# Patient Record
Sex: Female | Born: 1975 | Race: Black or African American | Hispanic: No | Marital: Single | Smoking: Never smoker
Health system: Southern US, Community
[De-identification: ages and names within clinical notes are randomized; demographics above are authoritative.]

## PROBLEM LIST (undated history)

## (undated) DIAGNOSIS — E538 Deficiency of other specified B group vitamins: Secondary | ICD-10-CM

## (undated) DIAGNOSIS — R51 Headache: Secondary | ICD-10-CM

## (undated) DIAGNOSIS — E739 Lactose intolerance, unspecified: Secondary | ICD-10-CM

## (undated) DIAGNOSIS — M545 Low back pain, unspecified: Secondary | ICD-10-CM

## (undated) DIAGNOSIS — R011 Cardiac murmur, unspecified: Secondary | ICD-10-CM

## (undated) DIAGNOSIS — D649 Anemia, unspecified: Secondary | ICD-10-CM

## (undated) DIAGNOSIS — E559 Vitamin D deficiency, unspecified: Secondary | ICD-10-CM

## (undated) DIAGNOSIS — J309 Allergic rhinitis, unspecified: Secondary | ICD-10-CM

## (undated) DIAGNOSIS — Q892 Congenital malformations of other endocrine glands: Secondary | ICD-10-CM

## (undated) DIAGNOSIS — R635 Abnormal weight gain: Secondary | ICD-10-CM

## (undated) DIAGNOSIS — R87619 Unspecified abnormal cytological findings in specimens from cervix uteri: Secondary | ICD-10-CM

## (undated) DIAGNOSIS — N943 Premenstrual tension syndrome: Secondary | ICD-10-CM

## (undated) HISTORY — DX: Allergic rhinitis, unspecified: J30.9

## (undated) HISTORY — DX: Abnormal weight gain: R63.5

## (undated) HISTORY — DX: Vitamin D deficiency, unspecified: E55.9

## (undated) HISTORY — DX: Anemia, unspecified: D64.9

## (undated) HISTORY — PX: OTHER SURGICAL HISTORY: SHX169

## (undated) HISTORY — DX: Congenital malformations of other endocrine glands: Q89.2

## (undated) HISTORY — DX: Morbid (severe) obesity due to excess calories: E66.01

## (undated) HISTORY — DX: Low back pain, unspecified: M54.50

## (undated) HISTORY — DX: Deficiency of other specified B group vitamins: E53.8

## (undated) HISTORY — DX: Low back pain: M54.5

## (undated) HISTORY — DX: Lactose intolerance, unspecified: E73.9

## (undated) HISTORY — DX: Unspecified abnormal cytological findings in specimens from cervix uteri: R87.619

## (undated) HISTORY — DX: Premenstrual tension syndrome: N94.3

---

## 2003-10-15 ENCOUNTER — Other Ambulatory Visit: Admission: RE | Admit: 2003-10-15 | Discharge: 2003-10-15 | Payer: Self-pay | Admitting: Family Medicine

## 2006-01-25 ENCOUNTER — Ambulatory Visit: Payer: Self-pay | Admitting: Family Medicine

## 2006-01-25 ENCOUNTER — Encounter (INDEPENDENT_AMBULATORY_CARE_PROVIDER_SITE_OTHER): Payer: Self-pay | Admitting: *Deleted

## 2006-01-25 ENCOUNTER — Other Ambulatory Visit: Admission: RE | Admit: 2006-01-25 | Discharge: 2006-01-25 | Payer: Self-pay | Admitting: Family Medicine

## 2006-01-25 LAB — CONVERTED CEMR LAB
AST: 14 units/L (ref 0–37)
Albumin: 3 g/dL — ABNORMAL LOW (ref 3.5–5.2)
Alkaline Phosphatase: 86 units/L (ref 39–117)
BUN: 5 mg/dL — ABNORMAL LOW (ref 6–23)
Basophils Absolute: 0.1 10*3/uL (ref 0.0–0.1)
CO2: 28 meq/L (ref 19–32)
GFR calc non Af Amer: 90 mL/min
Glomerular Filtration Rate, Af Am: 108 mL/min/{1.73_m2}
HCT: 36.1 % (ref 36.0–46.0)
Hemoglobin: 11.7 g/dL — ABNORMAL LOW (ref 12.0–15.0)
MCHC: 32.4 g/dL (ref 30.0–36.0)
Monocytes Relative: 5.9 % (ref 3.0–11.0)
Neutro Abs: 1.8 10*3/uL (ref 1.4–7.7)
Neutrophils Relative %: 44 % (ref 43.0–77.0)
Potassium: 4.4 meq/L (ref 3.5–5.1)
RBC: 4.72 M/uL (ref 3.87–5.11)
RDW: 17.1 % — ABNORMAL HIGH (ref 11.5–14.6)
Sodium: 138 meq/L (ref 135–145)
Total Bilirubin: 0.6 mg/dL (ref 0.3–1.2)
Total Protein: 7.1 g/dL (ref 6.0–8.3)
VLDL: 9 mg/dL (ref 0–40)

## 2006-08-23 ENCOUNTER — Ambulatory Visit: Payer: Self-pay | Admitting: Family Medicine

## 2006-08-23 ENCOUNTER — Other Ambulatory Visit: Admission: RE | Admit: 2006-08-23 | Discharge: 2006-08-23 | Payer: Self-pay | Admitting: Family Medicine

## 2006-08-23 ENCOUNTER — Encounter: Payer: Self-pay | Admitting: Family Medicine

## 2006-08-23 DIAGNOSIS — J45901 Unspecified asthma with (acute) exacerbation: Secondary | ICD-10-CM | POA: Insufficient documentation

## 2006-08-23 DIAGNOSIS — R87619 Unspecified abnormal cytological findings in specimens from cervix uteri: Secondary | ICD-10-CM

## 2006-08-23 HISTORY — DX: Unspecified abnormal cytological findings in specimens from cervix uteri: R87.619

## 2006-08-23 LAB — CONVERTED CEMR LAB
Blood in Urine, dipstick: NEGATIVE
Glucose, Urine, Semiquant: NEGATIVE
Nitrite: NEGATIVE
Pap Smear: ABNORMAL
Specific Gravity, Urine: 1.01
WBC Urine, dipstick: NEGATIVE
pH: 7

## 2006-08-29 ENCOUNTER — Encounter (INDEPENDENT_AMBULATORY_CARE_PROVIDER_SITE_OTHER): Payer: Self-pay | Admitting: *Deleted

## 2007-04-24 ENCOUNTER — Encounter: Payer: Self-pay | Admitting: Family Medicine

## 2007-04-24 ENCOUNTER — Other Ambulatory Visit: Admission: RE | Admit: 2007-04-24 | Discharge: 2007-04-24 | Payer: Self-pay | Admitting: Family Medicine

## 2007-04-24 ENCOUNTER — Ambulatory Visit: Payer: Self-pay | Admitting: Family Medicine

## 2007-04-24 DIAGNOSIS — J45909 Unspecified asthma, uncomplicated: Secondary | ICD-10-CM | POA: Insufficient documentation

## 2007-04-28 ENCOUNTER — Encounter (INDEPENDENT_AMBULATORY_CARE_PROVIDER_SITE_OTHER): Payer: Self-pay | Admitting: *Deleted

## 2007-05-23 ENCOUNTER — Ambulatory Visit: Payer: Self-pay | Admitting: Family Medicine

## 2009-02-24 ENCOUNTER — Ambulatory Visit: Payer: Self-pay | Admitting: Internal Medicine

## 2009-02-24 DIAGNOSIS — G43909 Migraine, unspecified, not intractable, without status migrainosus: Secondary | ICD-10-CM

## 2009-02-24 LAB — CONVERTED CEMR LAB
ALT: 13 units/L (ref 0–35)
Albumin: 3.2 g/dL — ABNORMAL LOW (ref 3.5–5.2)
Basophils Relative: 0.2 % (ref 0.0–3.0)
Bilirubin, Direct: 0.1 mg/dL (ref 0.0–0.3)
CO2: 28 meq/L (ref 19–32)
Chloride: 107 meq/L (ref 96–112)
Cholesterol: 151 mg/dL (ref 0–200)
Eosinophils Relative: 5.2 % — ABNORMAL HIGH (ref 0.0–5.0)
HCT: 32.8 % — ABNORMAL LOW (ref 36.0–46.0)
Ketones, ur: NEGATIVE mg/dL
LDL Cholesterol: 104 mg/dL — ABNORMAL HIGH (ref 0–99)
Leukocytes, UA: NEGATIVE
Lymphs Abs: 1.7 10*3/uL (ref 0.7–4.0)
MCV: 74.4 fL — ABNORMAL LOW (ref 78.0–100.0)
Monocytes Absolute: 0.2 10*3/uL (ref 0.1–1.0)
Neutro Abs: 1.7 10*3/uL (ref 1.4–7.7)
Potassium: 4.2 meq/L (ref 3.5–5.1)
RBC: 4.41 M/uL (ref 3.87–5.11)
Specific Gravity, Urine: 1.01 (ref 1.000–1.030)
TSH: 1.77 microintl units/mL (ref 0.35–5.50)
Total CHOL/HDL Ratio: 4
Total Protein: 7.1 g/dL (ref 6.0–8.3)
Urine Glucose: NEGATIVE mg/dL
WBC: 3.8 10*3/uL — ABNORMAL LOW (ref 4.5–10.5)
pH: 7.5 (ref 5.0–8.0)

## 2009-02-26 ENCOUNTER — Telehealth: Payer: Self-pay | Admitting: Internal Medicine

## 2009-02-26 ENCOUNTER — Encounter: Payer: Self-pay | Admitting: Internal Medicine

## 2010-02-16 ENCOUNTER — Ambulatory Visit: Payer: Self-pay | Admitting: Internal Medicine

## 2010-02-16 DIAGNOSIS — J309 Allergic rhinitis, unspecified: Secondary | ICD-10-CM

## 2010-04-12 LAB — CONVERTED CEMR LAB
ALT: 13 units/L (ref 0–35)
AST: 16 units/L (ref 0–37)
Basophils Relative: 1 % (ref 0.0–1.0)
Bilirubin, Direct: 0.1 mg/dL (ref 0.0–0.3)
CO2: 28 meq/L (ref 19–32)
Calcium: 8.9 mg/dL (ref 8.4–10.5)
Eosinophils Absolute: 0.2 10*3/uL (ref 0.0–0.6)
Eosinophils Relative: 5 % (ref 0.0–5.0)
GFR calc Af Amer: 108 mL/min
Glucose, Bld: 87 mg/dL (ref 70–99)
HCT: 34.7 % — ABNORMAL LOW (ref 36.0–46.0)
Hemoglobin: 11.3 g/dL — ABNORMAL LOW (ref 12.0–15.0)
Lymphocytes Relative: 40.7 % (ref 12.0–46.0)
MCV: 77.4 fL — ABNORMAL LOW (ref 78.0–100.0)
Neutro Abs: 1.8 10*3/uL (ref 1.4–7.7)
Neutrophils Relative %: 43.3 % (ref 43.0–77.0)
Platelets: 306 10*3/uL (ref 150–400)
Sodium: 141 meq/L (ref 135–145)
TSH: 2.11 microintl units/mL (ref 0.35–5.50)
Total Protein: 7.3 g/dL (ref 6.0–8.3)
Triglycerides: 50 mg/dL (ref 0–149)
VLDL: 10 mg/dL (ref 0–40)
WBC: 4 10*3/uL — ABNORMAL LOW (ref 4.5–10.5)

## 2010-04-15 NOTE — Assessment & Plan Note (Signed)
Summary: HEADACHES/NWS   Vital Signs:  Patient profile:   35 year old female Height:      64 inches (162.56 cm) Weight:      216 pounds BMI:     37.21 O2 Sat:      96 % on Room air Temp:     98.4 degrees F (36.89 degrees C) oral Pulse rate:   90 / minute BP sitting:   118 / 70  (left arm) Cuff size:   large  Vitals Entered By: Orlan Leavens RMA (February 16, 2010 4:04 PM)  O2 Flow:  Room air CC: Headaches Is Patient Diabetic? No Pain Assessment Patient in pain? no        Primary Care Provider:  Newt Lukes MD  CC:  Headaches.  History of Present Illness: here for headaches  re: her headaches, concerned taking too many Goodies 3-4x/week  Headaches      This is a 35 year old woman who presents with Headaches.  The current symptoms began 8-12 hrs ago.  On a scale of 1 to 10, the intensity is described as a 4 at this time.  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 today, headache has not responded quickly to Goody's so cam for eval - current symptoms different than usual menstral migraines. recent URI symptoms but no fever.  The patient reports nausea, nasal congestion, sinus pain, and sinus pressure, but denies vomiting, sweats, tearing of eyes, photophobia, and phonophobia.  The headache is described as intermittent and stabbing.  The location of the pain is left maxillary.  The patient denies the following high-risk features: fever, neck pain/stiffness, vision loss or change, focal weakness, altered mental status, rash, trauma, pain worse with exertion, and anticoagulation use.  The headaches are precipitated by stress and menses.  Prior treatment has included a NSAID.    Clinical Review Panels:  Lipid Management   Cholesterol:  151 (02/24/2009)   LDL (bad choesterol):  104 (02/24/2009)   HDL (good cholesterol):  35.80 (02/24/2009)   Triglycerides:  44 (01/25/2006)  CBC   WBC:  3.8 (02/24/2009)   RBC:  4.41  (02/24/2009)   Hgb:  10.5 (02/24/2009)   Hct:  32.8 (02/24/2009)   Platelets:  292.0 (02/24/2009)   MCV  74.4 (02/24/2009)   MCHC  32.1 (02/24/2009)   RDW  18.4 H % (02/24/2009)   PMN:  46.2 (02/24/2009)   Lymphs:  43.6 (02/24/2009)   Monos:  4.8 (02/24/2009)   Eosinophils:  5.2 (02/24/2009)   Basophil:  0.2 (02/24/2009)  Complete Metabolic Panel   Glucose:  78 (02/24/2009)   Sodium:  142 (02/24/2009)   Potassium:  4.2 (02/24/2009)   Chloride:  107 (02/24/2009)   CO2:  28 (02/24/2009)   BUN:  7 (02/24/2009)   Creatinine:  0.8 (02/24/2009)   Albumin:  3.2 (02/24/2009)   Total Protein:  7.1 (02/24/2009)   Calcium:  8.7 (02/24/2009)   Total Bili:  0.7 (02/24/2009)   Alk Phos:  80 (02/24/2009)   SGPT (ALT):  13 (02/24/2009)   SGOT (AST):  16 (02/24/2009)   Current Medications (verified): 1)  Goodys Body Pain 500-325 Mg Pack (Aspirin-Acetaminophen) .... Use Prn 2)  Allergy Sinus-D Max St 2-30-500 Mg Tabs (Chlorphen-Pseudoephed-Apap) .... Use Prn  Allergies (verified): 1)  ! Pcn 2)  ! Sulfa 3)  ! 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  The patient denies weight loss, vision loss, chest pain, syncope, hemoptysis, muscle weakness, and difficulty walking.    Physical Exam  General:  overweight-appearing.  alert, well-developed, well-nourished, and cooperative to examination. nontoxic and comfortable   Head:  Normocephalic and atraumatic without obvious abnormalities. No apparent alopecia or balding. Eyes:  vision grossly intact; pupils equal, round and reactive to light.  conjunctiva and lids normal.    Ears:  normal pinnae bilaterally, without erythema, swelling, or tenderness to palpation.  TMs clear, without effusion, or cerumen impaction. Hearing grossly normal bilaterally  Nose:  +sinus tenderness to palp Mouth:  teeth and gums in good repair; mucous membranes moist, without lesions or ulcers. oropharynx clear without exudate, no erythema. +PND Lungs:  normal respiratory effort, no intercostal retractions or use of accessory muscles; normal breath sounds bilaterally - no crackles and no wheezes.    Heart:  normal rate, regular rhythm, no murmur, and no rub. BLE without edema. normal DP pulses and normal cap refill in all 4 extremities    Neurologic:  alert & oriented X3 and cranial nerves II-XII symetrically intact.  strength normal in all extremities, sensation intact to light touch, and gait normal. speech fluent without dysarthria or aphasia; follows commands with good comprehension.    Impression & Recommendations:  Problem # 1:  MIGRAINE, MENSTRUAL (ICD-625.4)  failed control with OCP trials in past 12 months resume imitrex as needed and try low dose amitriptyline for prevention and mgmt -  explained same to pt including possioble risks and expected benefits - she understands and agrees to same current neuro exam benign but needs inc tx for sinus and allergy symptoms - see next f/u 6-8 weeks for med review and symptoms mgmt  The following medications were removed from the medication list:    Sumatriptan Succinate 6 Mg/0.23ml Soln (Sumatriptan succinate) .Marland Kitchen... 1 spray in nostril as needed for migraine - may repeat x 1 in 2 hours if persisiting headache symptoms Her updated medication list for this problem includes:    Goodys Body Pain 500-325 Mg Pack (Aspirin-acetaminophen) ..... Use as needed    Sumatriptan Succinate 100 Mg Tabs (Sumatriptan succinate) .Marland Kitchen... 1/2-1 by mouth as needed for migraine symptoms  Orders: Prescription Created Electronically [phone])  Problem # 2:  ALLERGIC RHINITIS (ICD-477.9)  add nasal steroid for sinus symptoms as this may be contrib to  current exam of HA symptoms - see above Her updated medication list for this problem includes:    Fluticasone Propionate 50 Mcg/act Susp (Fluticasone propionate) .Marland Kitchen... 2 sprasy each nostril  every morning for sinus and allergy symptoms - use everyday  Orders: Prescription Created Electronically [phone])  Complete Medication List: 1)  Goodys Body Pain 500-325 Mg Pack (Aspirin-acetaminophen) .... Use as needed 2)  Allergy Sinus-d Max St 2-30-500 Mg Tabs (Chlorphen-pseudoephed-apap) .... Use as needed 3)  Fluticasone Propionate 50 Mcg/act Susp (Fluticasone propionate) .... 2 sprasy each nostril  every morning for sinus and allergy symptoms - use everyday 4)  Amitriptyline Hcl 10 Mg Tabs (Amitriptyline hcl) .Marland Kitchen.. 1 by mouth at bedtime 5)  Sumatriptan Succinate 100 Mg Tabs (Sumatriptan succinate) .... 1/2-1 by mouth as needed for migraine symptoms  Patient Instructions: 1)  it was good to see you today. 2)  use generic flonase spray  for sinus and allergy symptoms - 3)  also start low dose amitriptyline for headache prevention and managment - 4)  still ok to use imitrex (pills) as needed for severe migraine symptoms around your cycle 5)  your prescriptions have been electronically submitted to your pharmacy. Please take as directed. Contact our office if you believe you're having problems with the medication(s).  6)  try to use less Goody's if able - frequent use (>3x/week) can make headache symptoms worse 7)  Please schedule a follow-up appointment in 6-8 weeks tor eview headache symptoms and medications, call sooner if problems.  Prescriptions: SUMATRIPTAN SUCCINATE 100 MG TABS (SUMATRIPTAN SUCCINATE) 1/2-1 by mouth as needed for migraine symptoms  #6 x 2   Entered and Authorized by:   Newt Lukes MD   Signed by:   Newt Lukes MD on 02/16/2010   Method used:   Electronically to        [facility] [address] [phone]* (retail)       [address]       [place], [place]  [id number]       Ph: [phone] or [phone]       Fax: [phone]   RxID:   [id number] AMITRIPTYLINE HCL 10 MG TABS (AMITRIPTYLINE HCL) 1 by mouth at bedtime  #30 x 3   Entered and Authorized by:   Newt Lukes MD   Signed by:   Newt Lukes MD on 02/16/2010   Method used:   [facility]        [facility] [address] [facility] [address] [phone]* (retail)       [address]       [place], [place]  [id number]       Ph: [phone] or [phone]       Fax: [phone]   RxID:   [id number] FLUTICASONE PROPIONATE 50 MCG/ACT SUSP (FLUTICASONE PROPIONATE) 2 sprasy each nostril  every morning for sinus and allergy symptoms - use everyday  #1 x 3   Entered and Authorized by:   Newt Lukes MD   Signed by:   Newt Lukes MD on 02/16/2010   Method used:   [facility]        [facility]  [address] [phone]* (retail)       [address]       [place], [place]  [address]       Ph: [phone] or [phone]       Fax: [phone]   RxID:   [id number]    Orders Added: 1)  Est. Patient Level IV [78242] 2)  Prescription Created Electronically [phone]

## 2010-06-02 ENCOUNTER — Other Ambulatory Visit: Payer: Self-pay | Admitting: Obstetrics & Gynecology

## 2010-09-17 ENCOUNTER — Encounter: Payer: Self-pay | Admitting: Internal Medicine

## 2010-09-18 ENCOUNTER — Other Ambulatory Visit (INDEPENDENT_AMBULATORY_CARE_PROVIDER_SITE_OTHER): Payer: BC Managed Care – PPO

## 2010-09-18 ENCOUNTER — Ambulatory Visit (INDEPENDENT_AMBULATORY_CARE_PROVIDER_SITE_OTHER): Payer: BC Managed Care – PPO | Admitting: Internal Medicine

## 2010-09-18 ENCOUNTER — Encounter: Payer: Self-pay | Admitting: Internal Medicine

## 2010-09-18 ENCOUNTER — Other Ambulatory Visit (INDEPENDENT_AMBULATORY_CARE_PROVIDER_SITE_OTHER): Payer: BC Managed Care – PPO | Admitting: Internal Medicine

## 2010-09-18 VITALS — BP 110/78 | HR 82 | Temp 97.8°F | Ht 64.0 in | Wt 236.0 lb

## 2010-09-18 DIAGNOSIS — R220 Localized swelling, mass and lump, head: Secondary | ICD-10-CM

## 2010-09-18 DIAGNOSIS — J309 Allergic rhinitis, unspecified: Secondary | ICD-10-CM

## 2010-09-18 DIAGNOSIS — Z0000 Encounter for general adult medical examination without abnormal findings: Secondary | ICD-10-CM

## 2010-09-18 DIAGNOSIS — R221 Localized swelling, mass and lump, neck: Secondary | ICD-10-CM

## 2010-09-18 LAB — CBC WITH DIFFERENTIAL/PLATELET
Basophils Relative: 1.3 % (ref 0.0–3.0)
Eosinophils Relative: 5.4 % — ABNORMAL HIGH (ref 0.0–5.0)
Hemoglobin: 8.9 g/dL — ABNORMAL LOW (ref 12.0–15.0)
Lymphocytes Relative: 39.5 % (ref 12.0–46.0)
MCHC: 31.4 g/dL (ref 30.0–36.0)
MCV: 61.6 fl — ABNORMAL LOW (ref 78.0–100.0)
Neutro Abs: 2.5 10*3/uL (ref 1.4–7.7)
Neutrophils Relative %: 48.2 % (ref 43.0–77.0)
RBC: 4.57 Mil/uL (ref 3.87–5.11)
WBC: 5.2 10*3/uL (ref 4.5–10.5)

## 2010-09-18 LAB — BASIC METABOLIC PANEL
CO2: 28 mEq/L (ref 19–32)
Calcium: 8.7 mg/dL (ref 8.4–10.5)
Chloride: 109 mEq/L (ref 96–112)
Creatinine, Ser: 0.7 mg/dL (ref 0.4–1.2)
Sodium: 140 mEq/L (ref 135–145)

## 2010-09-18 LAB — HEPATIC FUNCTION PANEL
Albumin: 3.6 g/dL (ref 3.5–5.2)
Alkaline Phosphatase: 77 U/L (ref 39–117)
Total Protein: 7.5 g/dL (ref 6.0–8.3)

## 2010-09-18 LAB — URINALYSIS, ROUTINE W REFLEX MICROSCOPIC
Ketones, ur: NEGATIVE
Specific Gravity, Urine: 1.02 (ref 1.000–1.030)
Total Protein, Urine: NEGATIVE
Urine Glucose: NEGATIVE

## 2010-09-18 LAB — LIPID PANEL
HDL: 38.6 mg/dL — ABNORMAL LOW (ref >39.00)
LDL Cholesterol: 97 mg/dL (ref 0–99)
Total CHOL/HDL Ratio: 4
Triglycerides: 47 mg/dL (ref 0.0–149.0)
VLDL: 9.4 mg/dL (ref 0.0–40.0)

## 2010-09-18 LAB — FERRITIN: Ferritin: 5 ng/mL — ABNORMAL LOW (ref 10.0–291.0)

## 2010-09-18 LAB — TSH: TSH: 2.45 u[IU]/mL (ref 0.35–5.50)

## 2010-09-18 MED ORDER — FLUTICASONE PROPIONATE 50 MCG/ACT NA SUSP: 2.0000 | NASAL | Status: DC

## 2010-09-18 NOTE — Patient Instructions (Signed)
It was good to see you today. We have reviewed your prior records including labs and tests today we'll make referral for neck ultrasound. Our office will contact you regarding appointment(s) once made and again after results reviewed Lab test(s) ordered today. Your results will be called to you after review (48-72hours after test completion). If any changes need to be made, you will be notified at that time. Watch the weight! Work on lifestyle changes as discussed (low fat, low carb, increased protein diet; improved exercise efforts; weight loss) to control sugar, blood pressure and cholesterol levels and/or reduce risk of developing other medical problems. Look into [email or web address] or other type of food journal to assist you in this process. Medications reviewed, no changes at this time. Refill on medication(s) as discussed today.

## 2010-09-18 NOTE — Progress Notes (Signed)
Subjective:    Patient ID: Belinda Williams, female    DOB: 1975-09-30, 35 y.o.   MRN: [id number]  HPI patient is here today for annual physical. Patient feels well today  Also concern about lump in neck Onset noted 2 weeks ago - no change in size since that time No sticking with swallow, no burning or choking No trauma -   Past Medical History  Diagnosis Date  . PAP SMEAR, ABNORMAL 08/23/2006  . ALLERGIC RHINITIS   . ASTHMA NOS W/ACUTE EXACERBATION   . MIGRAINE, MENSTRUAL   . Morbid obesity    Family History  Problem Relation Age of Onset  . Breast cancer Maternal Aunt   . Diabetes Maternal Grandmother   . Hypertension Maternal Grandmother   . Stroke Maternal Grandmother   . Hypertension Other    History  Substance Use Topics  . Smoking status: Never Smoker   . Smokeless tobacco: Not on file  . Alcohol Use: Yes   Review of Systems Constitutional: Negative for fever.  Respiratory: Negative for cough and shortness of breath.   ENT: hard "knot" palpable in throat - no dysphagia - no odynophagia, no sore throat Cardiovascular: Negative for chest pain.  Gastrointestinal: Negative for abdominal pain.  Musculoskeletal: Negative for gait problem.  Skin: Negative for rash.  Neurological: Negative for dizziness.  No other specific complaints in a complete review of systems (except as listed in HPI above).     Objective:   Physical Exam BP 110/78  Pulse 82  Temp(Src) 97.8 F (36.6 C) (Oral)  Ht 5\' 4"  (1.626 m)  Wt 236 lb (107.049 kg)  BMI 40.51 kg/m2  SpO2 98% Wt Readings from Last 3 Encounters:  09/18/10 236 lb (107.049 kg)  02/16/10 216 lb (97.977 kg)  02/24/09 216 lb 12.8 oz (98.34 kg)   Physical Exam  Constitutional: She is overweight; oriented to person, place, and time. She appears well-developed and well-nourished. No distress.  HENT: Head: Normocephalic and atraumatic. Ears; B TMs ok, no erythema or effusion; Nose: Nose normal.  Mouth/Throat: Oropharynx  is clear and moist. No oropharyngeal exudate.   Eyes: Conjunctivae and EOM are normal. Pupils are equal, round, and reactive to light. No scleral icterus.  Neck: thick. Normal range of motion. Neck supple. No JVD present. No thyromegaly present or thyroid nodules. hard 1cm nodular prominence to L side of anterior neck near cricoid Cardiovascular: Normal rate, regular rhythm and normal heart sounds.  No murmur heard. No BLE edema. Pulmonary/Chest: Effort normal and breath sounds normal. No respiratory distress. She has no wheezes.  Abdominal: Soft. Bowel sounds are normal. She exhibits no distension. There is no tenderness.  Musculoskeletal: Normal range of motion, no joint effusions. No gross deformities Neurological: She is alert and oriented to person, place, and time. No cranial nerve deficit. Coordination normal.  Skin: Skin is warm and dry. No rash noted. No erythema.  Psychiatric: She has a normal mood and affect. Her behavior is normal. Judgment and thought content normal.       Lab Results  Component Value Date   WBC 3.8* 02/24/2009   HGB 10.5* 02/24/2009   HCT 32.8* 02/24/2009   PLT 292.0 02/24/2009   CHOL 151 02/24/2009   TRIG 57.0 02/24/2009   HDL 35.80* 02/24/2009   ALT 13 02/24/2009   AST 16 02/24/2009   NA 142 02/24/2009   K 4.2 02/24/2009   CL 107 02/24/2009   CREATININE 0.8 02/24/2009   BUN 7 02/24/2009   CO2 28  02/24/2009   TSH 1.77 02/24/2009     Assessment & Plan:  CPX - v70.0 - Patient has been counseled on age-appropriate routine health concerns for screening and prevention. These are reviewed and up-to-date. Immunizations are up-to-date or declined. Labs ordered - will be reviewed.  Neck mass - 1cm L anterior neck - ?bony prominence vs other - check soft tissue US and consider refer to ENT/other as needed  Allergic rhinitis - improved overall with flonase - continue same

## 2010-09-24 ENCOUNTER — Ambulatory Visit
Admission: RE | Admit: 2010-09-24 | Discharge: 2010-09-24 | Disposition: A | Discharge status: Home / Self Care | Payer: BC Managed Care – PPO | Source: Ambulatory Visit | Attending: Internal Medicine | Admitting: Internal Medicine

## 2010-09-24 ENCOUNTER — Other Ambulatory Visit: Payer: Self-pay | Admitting: Internal Medicine

## 2010-09-24 DIAGNOSIS — R221 Localized swelling, mass and lump, neck: Secondary | ICD-10-CM

## 2010-09-30 ENCOUNTER — Ambulatory Visit (HOSPITAL_COMMUNITY)
Admission: RE | Admit: 2010-09-30 | Discharge: 2010-09-30 | Disposition: A | Discharge status: Home / Self Care | Payer: BC Managed Care – PPO | Source: Ambulatory Visit | Attending: Internal Medicine | Admitting: Internal Medicine

## 2010-09-30 DIAGNOSIS — R221 Localized swelling, mass and lump, neck: Secondary | ICD-10-CM

## 2010-09-30 DIAGNOSIS — R9389 Abnormal findings on diagnostic imaging of other specified body structures: Secondary | ICD-10-CM | POA: Insufficient documentation

## 2010-09-30 DIAGNOSIS — R599 Enlarged lymph nodes, unspecified: Secondary | ICD-10-CM | POA: Insufficient documentation

## 2010-09-30 DIAGNOSIS — E079 Disorder of thyroid, unspecified: Secondary | ICD-10-CM | POA: Insufficient documentation

## 2010-09-30 MED ORDER — GADOBENATE DIMEGLUMINE 529 MG/ML IV SOLN
20.0000 mL | Freq: Once | INTRAVENOUS | Status: AC | PRN
  Administered 2010-09-30: 20 mL via INTRAVENOUS

## 2010-10-19 ENCOUNTER — Ambulatory Visit (INDEPENDENT_AMBULATORY_CARE_PROVIDER_SITE_OTHER): Payer: BC Managed Care – PPO | Admitting: Endocrinology

## 2010-10-19 ENCOUNTER — Encounter: Payer: Self-pay | Admitting: Endocrinology

## 2010-10-19 DIAGNOSIS — E042 Nontoxic multinodular goiter: Secondary | ICD-10-CM

## 2010-10-19 DIAGNOSIS — Q892 Congenital malformations of other endocrine glands: Secondary | ICD-10-CM

## 2010-10-19 NOTE — Patient Instructions (Addendum)
Let's check a biopsy of the 2 thyroid nodules.  you will receive a phone call, with a day and time for an appointment. A few days later, please call [phone] to hear your test results.  You will be prompted to enter the 9-digit "MRN" number that appears at the top left of this page, followed by #.  Then you will hear the message. If, (as expected), no cancer is seen, please return here in 6-12 months.   No treatment is needed for the cyst you can feel on your neck, as it is harmless.

## 2010-10-19 NOTE — Progress Notes (Signed)
Subjective:    Patient ID: Belinda Williams, female    DOB: [birth year], 35 y.o.   MRN: [id number]  HPI Pt states 6 weeks of moderate sensation of fullness at the anterior neck.  No assoc dysphagia. Past Medical History  Diagnosis Date  . PAP SMEAR, ABNORMAL 08/23/2006  . ALLERGIC RHINITIS   . MIGRAINE, MENSTRUAL   . Morbid obesity   . Anemia   . Asthma     Past Surgical History  Procedure Date  . No past surgeries     History   Social History  . Marital Status: Single    Spouse Name: N/A    Number of Children: N/A  . Years of Education: N/A   Occupational History  . Not on file.   Social History Main Topics  . Smoking status: Never Smoker   . Smokeless tobacco: Not on file  . Alcohol Use: Yes  . Drug Use: No  . Sexually Active: No     Single, not sexual active at this time   Other Topics Concern  . Not on file   Social History Narrative  . No narrative on file    Current Outpatient Prescriptions on File Prior to Visit  Medication Sig Dispense Refill  . amitriptyline (ELAVIL) 10 MG tablet Take 10 mg by mouth at bedtime.        . Aspirin-Acetaminophen (GOODY BODY PAIN) 500-325 MG PACK Take by mouth as needed.        . fluticasone (FLONASE) 50 MCG/ACT nasal spray Place 2 sprays into the nose every morning.  16 g  6  . SUMAtriptan (IMITREX) 100 MG tablet Take 100 mg by mouth every 2 (two) hours as needed.          Allergies  Allergen Reactions  . Ciprofloxacin   . Penicillins   . Sulfonamide Derivatives     Family History  Problem Relation Age of Onset  . Breast cancer Maternal Aunt   . Cancer Maternal Aunt     breast  . Diabetes Maternal Grandmother   . Hypertension Maternal Grandmother   . Stroke Maternal Grandmother   . Hypertension Other   no goiter or other thyroid probs.   BP 122/82  Pulse 83  Temp(Src) 98 F (36.7 C) (Oral)  Ht 5' 4.5" (1.638 m)  Wt 233 lb 9.6 oz (105.96 kg)  BMI 39.48 kg/m2  SpO2 99%  LMP 10/11/2010  Review of  Systems denies hoarseness, double vision, palpitations, sob, diarrhea, polyuria, excessive diaphoresis, numbness, tremor, anxiety, hypoglycemia, and rhinorrhea.  She has chronic headache and weight gain.  She has fatigue, easy bruising, and myalgias.    Objective:   Physical Exam VS: see vs page GEN: no distress HEAD: head: no deformity eyes: no periorbital swelling, no proptosis external nose and ears are normal mouth: no lesion seen NECK: the thyroglossal duct cyst is easily palpable at the midline.  At the thyroid area, the appears to be enlargement, but i cannot tell details CHEST WALL: no deformity CV: reg rate and rhythm, no murmur ABD: abdomen is soft, nontender.  no hepatosplenomegaly.  not distended.  no hernia. MUSCULOSKELETAL: muscle bulk and strength are grossly normal.  no obvious joint swelling.  gait is normal and steady EXTEMITIES: no deformity.  no ulcer on the feet.  feet are of normal color and temp.  no edema PULSES: dorsalis pedis intact bilat.  no carotid bruit NEURO:  cn 2-12 grossly intact.   readily moves all 4's.  sensation is  intact to touch on the feet SKIN:  Normal texture and temperature.  No rash or suspicious lesion is visible.   NODES:  None palpable at the neck PSYCH: alert, oriented x3.  Does not appear anxious nor depressed.  outside test results are reviewed: ULTRASOUND: 1. The patient's palpable concern is a dominant complex solid and  cystic lesion in the midline submandibular region suspicious for  inflammation or neoplasm arising from a thyroglossal duct remnant.  MRI of the neck with and without contrast is recommended for  further evaluation.  2. There are solid nodules in both thyroid lobes without highly  suspicious characteristics other than size. Depending on the  etiology of the palpable concern, these lesions can be addressed  with follow-up ultrasound.    MRI: 15 x 17 x 9 mm cystic and solid lesion correlating with the    sonographic abnormality in the area of palpable concern; findings  are most consistent with a thyroglossal duct cyst. Mild  surrounding inflammation suggests possible superimposed infection.  Superimposed thyroid neoplasm within these lesions is extremely  rare and is felt to be unlikely Lab Results  Component Value Date   TSH 2.45 09/18/2010      Assessment & Plan:  Multinodular goiter, usually hereditary.   Euthyroid now. 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

## 2010-10-22 ENCOUNTER — Other Ambulatory Visit: Payer: Self-pay | Admitting: Endocrinology

## 2010-10-22 DIAGNOSIS — E042 Nontoxic multinodular goiter: Secondary | ICD-10-CM

## 2010-10-27 ENCOUNTER — Other Ambulatory Visit (HOSPITAL_COMMUNITY)
Admission: RE | Admit: 2010-10-27 | Discharge: 2010-10-27 | Disposition: A | Discharge status: Home / Self Care | Payer: BC Managed Care – PPO | Source: Ambulatory Visit | Attending: Diagnostic Radiology | Admitting: Diagnostic Radiology

## 2010-10-27 ENCOUNTER — Ambulatory Visit
Admission: RE | Admit: 2010-10-27 | Discharge: 2010-10-27 | Disposition: A | Discharge status: Home / Self Care | Payer: BC Managed Care – PPO | Source: Ambulatory Visit | Attending: Endocrinology | Admitting: Endocrinology

## 2010-10-27 DIAGNOSIS — E049 Nontoxic goiter, unspecified: Secondary | ICD-10-CM | POA: Insufficient documentation

## 2010-10-27 DIAGNOSIS — E042 Nontoxic multinodular goiter: Secondary | ICD-10-CM

## 2011-04-15 ENCOUNTER — Telehealth: Payer: Self-pay | Admitting: *Deleted

## 2011-04-15 DIAGNOSIS — Q892 Congenital malformations of other endocrine glands: Secondary | ICD-10-CM

## 2011-04-15 NOTE — Telephone Encounter (Signed)
Pt states she would like a referral to see a surgeon concerning her thyroid. Had biopsy & was told that they would watch it, but pt states it is leaking, and requesting it to be drained.Marland KitchenMarland Kitchen1/31/13@2 :30pm/LMB

## 2011-04-16 NOTE — Telephone Encounter (Signed)
Notified pt md ok referral will receive call back from [facility] with appt, date, and time.. 04/16/11@10 :36am/LMB

## 2011-06-29 ENCOUNTER — Ambulatory Visit (INDEPENDENT_AMBULATORY_CARE_PROVIDER_SITE_OTHER): Payer: BC Managed Care – PPO | Admitting: Endocrinology

## 2011-06-29 ENCOUNTER — Encounter: Payer: Self-pay | Admitting: Endocrinology

## 2011-06-29 VITALS — BP 120/92 | HR 87 | Temp 98.2°F | Ht 64.5 in | Wt 234.0 lb

## 2011-06-29 DIAGNOSIS — E042 Nontoxic multinodular goiter: Secondary | ICD-10-CM

## 2011-06-29 DIAGNOSIS — Q892 Congenital malformations of other endocrine glands: Secondary | ICD-10-CM

## 2011-06-29 MED ORDER — DOXYCYCLINE HYCLATE 100 MG PO TABS: 100.0000 mg | ORAL_TABLET | Freq: Two times a day (BID) | ORAL | Status: AC

## 2011-06-29 NOTE — Progress Notes (Signed)
  Subjective:    Patient ID: Belinda Williams, female    DOB: [birth year], 36 y.o.   MRN: [id number]  HPI Pt states few mos of intermittent slight drainage from the anterior neck, and assoc pain.  LMP was 2 weeks ago. Past Medical History  Diagnosis Date  . PAP SMEAR, ABNORMAL 08/23/2006  . ALLERGIC RHINITIS   . MIGRAINE, MENSTRUAL   . Morbid obesity   . Anemia   . Asthma     Past Surgical History  Procedure Date  . No past surgeries     History   Social History  . Marital Status: Single    Spouse Name: N/A    Number of Children: N/A  . Years of Education: N/A   Occupational History  . Not on file.   Social History Main Topics  . Smoking status: Never Smoker   . Smokeless tobacco: Not on file  . Alcohol Use: Yes  . Drug Use: No  . Sexually Active: No     Single, not sexual active at this time   Other Topics Concern  . Not on file   Social History Narrative  . No narrative on file    Current Outpatient Prescriptions on File Prior to Visit  Medication Sig Dispense Refill  . Aspirin-Acetaminophen (GOODY BODY PAIN) 500-325 MG PACK Take by mouth as needed.        . fluticasone (FLONASE) 50 MCG/ACT nasal spray Place 2 sprays into the nose every morning.  16 g  6  . amitriptyline (ELAVIL) 10 MG tablet Take 10 mg by mouth at bedtime.          Allergies  Allergen Reactions  . Ciprofloxacin   . Penicillins   . Sulfonamide Derivatives     Family History  Problem Relation Age of Onset  . Breast cancer Maternal Aunt   . Cancer Maternal Aunt     breast  . Diabetes Maternal Grandmother   . Hypertension Maternal Grandmother   . Stroke Maternal Grandmother   . Hypertension Other     BP 120/92  Pulse 87  Temp(Src) 98.2 F (36.8 C) (Oral)  Ht 5' 4.5" (1.638 m)  Wt 234 lb (106.142 kg)  BMI 39.55 kg/m2  SpO2 98%  LMP 06/20/2011   Review of Systems Denies fever.  She denies swelling at the thyroid area    Objective:   Physical Exam VITAL SIGNS:  See vs  page GENERAL: no distress At the midline of the anterior neck, at the submandibular area:  There is a 1 cm x 2 mm transverse shallow ulcer.  i do not see any drainage now.     Assessment & Plan:  Thyroglossal duct cyst, draining percutaneously, new.  She has risk of developing a chronically draining tract.

## 2011-06-29 NOTE — Patient Instructions (Addendum)
i have sent a prescription to your pharmacy, for an antibiotic.   You should keep the area covered with antibiotic ointment, and a bandaid. I hope you feel better soon.  If you don't feel better in 2 weeks, please call back.   Let's recheck the thyroid ultrasound.  you will receive a phone call, about a day and time for an appointment.  blood tests are being requested for you today.  You will receive a letter with results. Please return in 1 year.

## 2011-07-06 ENCOUNTER — Telehealth: Payer: Self-pay

## 2011-07-06 NOTE — Telephone Encounter (Signed)
Pt called stating Doxycycline is causing vomiting, heartburn and abd cramps. Pt is requesting MD advisement.

## 2011-07-06 NOTE — Telephone Encounter (Signed)
Try stopping the med, and see how you feel

## 2011-07-06 NOTE — Telephone Encounter (Signed)
Pt informed of MD's advisement. 

## 2011-07-15 ENCOUNTER — Encounter: Payer: Self-pay | Admitting: Endocrinology

## 2011-07-15 ENCOUNTER — Ambulatory Visit
Admission: RE | Admit: 2011-07-15 | Discharge: 2011-07-15 | Disposition: A | Discharge status: Home / Self Care | Payer: BC Managed Care – PPO | Source: Ambulatory Visit | Attending: Endocrinology | Admitting: Endocrinology

## 2011-07-15 DIAGNOSIS — E042 Nontoxic multinodular goiter: Secondary | ICD-10-CM

## 2011-07-16 ENCOUNTER — Telehealth: Payer: Self-pay | Admitting: *Deleted

## 2011-07-16 DIAGNOSIS — Q892 Congenital malformations of other endocrine glands: Secondary | ICD-10-CM

## 2011-07-16 NOTE — Telephone Encounter (Signed)
Pt informed

## 2011-07-16 NOTE — Telephone Encounter (Signed)
Called pt to inform of thyroid U/S results, pt informed. Pt states that she is still having drainage from thyroid cyst. Pt is asking MD's advisement on whether she can try another ABX or if she needs referral to ENT specialist.

## 2011-07-16 NOTE — Telephone Encounter (Signed)
i have ref to ent

## 2011-08-14 HISTORY — PX: DERMOID CYST  EXCISION: SHX1452

## 2011-08-23 ENCOUNTER — Other Ambulatory Visit: Payer: Self-pay | Admitting: Otolaryngology

## 2012-02-13 HISTORY — PX: DILATATION & CURRETTAGE/HYSTEROSCOPY WITH RESECTOCOPE: SHX5572

## 2012-02-15 ENCOUNTER — Encounter (HOSPITAL_COMMUNITY): Payer: Self-pay | Admitting: *Deleted

## 2012-02-22 ENCOUNTER — Encounter (HOSPITAL_COMMUNITY): Payer: Self-pay | Admitting: Pharmacist

## 2012-02-24 ENCOUNTER — Other Ambulatory Visit: Payer: Self-pay | Admitting: Obstetrics & Gynecology

## 2012-02-27 MED ORDER — METRONIDAZOLE IN NACL 5-0.79 MG/ML-% IV SOLN
500.0000 mg | INTRAVENOUS | Status: AC
  Administered 2012-02-28: .5 g via INTRAVENOUS
  Filled 2012-02-27: qty 100

## 2012-02-28 ENCOUNTER — Ambulatory Visit (HOSPITAL_COMMUNITY): Payer: BC Managed Care – PPO | Admitting: Anesthesiology

## 2012-02-28 ENCOUNTER — Encounter (HOSPITAL_COMMUNITY): Payer: Self-pay | Admitting: Anesthesiology

## 2012-02-28 ENCOUNTER — Encounter (HOSPITAL_COMMUNITY)
Admission: RE | Disposition: A | Discharge status: Home / Self Care | Payer: Self-pay | Source: Ambulatory Visit | Attending: Obstetrics & Gynecology

## 2012-02-28 ENCOUNTER — Ambulatory Visit (HOSPITAL_COMMUNITY)
Admission: RE | Admit: 2012-02-28 | Discharge: 2012-02-28 | Disposition: A | Discharge status: Home / Self Care | Payer: BC Managed Care – PPO | Source: Ambulatory Visit | Attending: Obstetrics & Gynecology | Admitting: Obstetrics & Gynecology

## 2012-02-28 DIAGNOSIS — D250 Submucous leiomyoma of uterus: Secondary | ICD-10-CM | POA: Insufficient documentation

## 2012-02-28 DIAGNOSIS — N840 Polyp of corpus uteri: Secondary | ICD-10-CM | POA: Insufficient documentation

## 2012-02-28 HISTORY — DX: Cardiac murmur, unspecified: R01.1

## 2012-02-28 HISTORY — DX: Headache: R51

## 2012-02-28 LAB — CBC
HCT: 31.6 % — ABNORMAL LOW (ref 36.0–46.0)
MCH: 20.4 pg — ABNORMAL LOW (ref 26.0–34.0)
MCHC: 32 g/dL (ref 30.0–36.0)
RDW: 24.5 % — ABNORMAL HIGH (ref 11.5–15.5)

## 2012-02-28 LAB — PREGNANCY, URINE: Preg Test, Ur: NEGATIVE

## 2012-02-28 SURGERY — DILATATION & CURETTAGE/HYSTEROSCOPY WITH TRUCLEAR
Anesthesia: General | Site: Uterus | Wound class: Clean Contaminated

## 2012-02-28 MED ORDER — CHLOROPROCAINE HCL 1 % IJ SOLN
INTRAMUSCULAR | Status: AC
  Filled 2012-02-28: qty 30

## 2012-02-28 MED ORDER — PROPOFOL 10 MG/ML IV EMUL
INTRAVENOUS | Status: AC
  Filled 2012-02-28: qty 20

## 2012-02-28 MED ORDER — FENTANYL CITRATE 0.05 MG/ML IJ SOLN
INTRAMUSCULAR | Status: DC | PRN
  Administered 2012-02-28 (×2): 50 ug via INTRAVENOUS

## 2012-02-28 MED ORDER — CHLOROPROCAINE HCL 1 % IJ SOLN
INTRAMUSCULAR | Status: DC | PRN
  Administered 2012-02-28: 20 mL

## 2012-02-28 MED ORDER — MIDAZOLAM HCL 5 MG/5ML IJ SOLN
INTRAMUSCULAR | Status: DC | PRN
  Administered 2012-02-28: 2 mg via INTRAVENOUS

## 2012-02-28 MED ORDER — ONDANSETRON HCL 4 MG/2ML IJ SOLN
INTRAMUSCULAR | Status: DC | PRN
  Administered 2012-02-28: 4 mg via INTRAVENOUS

## 2012-02-28 MED ORDER — LIDOCAINE HCL (CARDIAC) 20 MG/ML IV SOLN
INTRAVENOUS | Status: AC
  Filled 2012-02-28: qty 5

## 2012-02-28 MED ORDER — FLUMAZENIL 0.5 MG/5ML IV SOLN
INTRAVENOUS | Status: DC | PRN
  Administered 2012-02-28: 0.2 mg via INTRAVENOUS

## 2012-02-28 MED ORDER — KETOROLAC TROMETHAMINE 30 MG/ML IJ SOLN
INTRAMUSCULAR | Status: DC | PRN
  Administered 2012-02-28: 30 mg via INTRAVENOUS

## 2012-02-28 MED ORDER — KETOROLAC TROMETHAMINE 30 MG/ML IJ SOLN
INTRAMUSCULAR | Status: AC
  Filled 2012-02-28: qty 1

## 2012-02-28 MED ORDER — FLUMAZENIL 1 MG/10ML IV SOLN
INTRAVENOUS | Status: AC
  Filled 2012-02-28: qty 10

## 2012-02-28 MED ORDER — LIDOCAINE HCL (CARDIAC) 20 MG/ML IV SOLN
INTRAVENOUS | Status: DC | PRN
  Administered 2012-02-28 (×2): 30 mg via INTRAVENOUS

## 2012-02-28 MED ORDER — ONDANSETRON HCL 4 MG/2ML IJ SOLN
INTRAMUSCULAR | Status: AC
  Filled 2012-02-28: qty 2

## 2012-02-28 MED ORDER — FENTANYL CITRATE 0.05 MG/ML IJ SOLN
INTRAMUSCULAR | Status: AC
  Administered 2012-02-28: 50 ug via INTRAVENOUS
  Filled 2012-02-28: qty 2

## 2012-02-28 MED ORDER — ONDANSETRON HCL 4 MG/2ML IJ SOLN: 4.0000 mg | Freq: Once | INTRAMUSCULAR | Status: DC | PRN

## 2012-02-28 MED ORDER — MIDAZOLAM HCL 2 MG/2ML IJ SOLN
INTRAMUSCULAR | Status: AC
  Filled 2012-02-28: qty 2

## 2012-02-28 MED ORDER — MEPERIDINE HCL 25 MG/ML IJ SOLN: 6.2500 mg | INTRAMUSCULAR | Status: DC | PRN

## 2012-02-28 MED ORDER — FENTANYL CITRATE 0.05 MG/ML IJ SOLN
INTRAMUSCULAR | Status: AC
  Filled 2012-02-28: qty 2

## 2012-02-28 MED ORDER — KETOROLAC TROMETHAMINE 30 MG/ML IJ SOLN: 15.0000 mg | Freq: Once | INTRAMUSCULAR | Status: DC | PRN

## 2012-02-28 MED ORDER — OXYCODONE-ACETAMINOPHEN 7.5-325 MG PO TABS: 1.0000 | ORAL_TABLET | ORAL | Status: DC | PRN

## 2012-02-28 MED ORDER — FENTANYL CITRATE 0.05 MG/ML IJ SOLN
25.0000 ug | INTRAMUSCULAR | Status: DC | PRN
  Administered 2012-02-28: 50 ug via INTRAVENOUS

## 2012-02-28 MED ORDER — LACTATED RINGERS IV SOLN
INTRAVENOUS | Status: DC
  Administered 2012-02-28 (×2): via INTRAVENOUS

## 2012-02-28 MED ORDER — PROPOFOL 10 MG/ML IV EMUL
INTRAVENOUS | Status: DC | PRN
  Administered 2012-02-28: 170 mg via INTRAVENOUS

## 2012-02-28 MED ORDER — SODIUM CHLORIDE 0.9 % IR SOLN
Status: DC | PRN
  Administered 2012-02-28: 1

## 2012-02-28 SURGICAL SUPPLY — 21 items
BLADE INCISOR TRUC PLUS 2.9 (ABLATOR) IMPLANT
CANISTERS HI-FLOW 3000CC (CANNISTER) ×4 IMPLANT
CATH ROBINSON RED A/P 16FR (CATHETERS) ×2 IMPLANT
CLOTH BEACON ORANGE TIMEOUT ST (SAFETY) ×2 IMPLANT
CONTAINER PREFILL 10% NBF 60ML (FORM) ×4 IMPLANT
DRAPE HYSTEROSCOPY (DRAPE) ×2 IMPLANT
DRESSING TELFA 8X3 (GAUZE/BANDAGES/DRESSINGS) ×2 IMPLANT
GLOVE BIO SURGEON STRL SZ 6.5 (GLOVE) ×4 IMPLANT
GLOVE BIOGEL PI IND STRL 7.0 (GLOVE) ×1 IMPLANT
GLOVE BIOGEL PI INDICATOR 7.0 (GLOVE) ×1
GOWN STRL REIN XL XLG (GOWN DISPOSABLE) ×4 IMPLANT
INCISOR TRUC PLUS BLADE 2.9 (ABLATOR)
KIT HYSTEROSCOPY TRUCLEAR (ABLATOR) IMPLANT
MORCELLATOR RECIP TRUCLEAR 4.0 (ABLATOR) IMPLANT
NDL SPNL 22GX3.5 QUINCKE BK (NEEDLE) ×1 IMPLANT
NEEDLE SPNL 22GX3.5 QUINCKE BK (NEEDLE) ×2 IMPLANT
PACK VAGINAL MINOR WOMEN LF (CUSTOM PROCEDURE TRAY) ×2 IMPLANT
PAD OB MATERNITY 4.3X12.25 (PERSONAL CARE ITEMS) ×2 IMPLANT
SYR CONTROL 10ML LL (SYRINGE) ×2 IMPLANT
TOWEL OR 17X24 6PK STRL BLUE (TOWEL DISPOSABLE) ×4 IMPLANT
WATER STERILE IRR 1000ML POUR (IV SOLUTION) ×2 IMPLANT

## 2012-02-28 NOTE — H&P (Signed)
Belinda Williams is an 36 y.o. female   Pertinent Gynecological History: Menses: flow is excessive with use of many pads or tampons on heaviest days Contraception: abstinence Blood transfusions: none Sexually transmitted diseases: no past hystory Last pap: wnl  Menstrual History:  Patient's last menstrual period was 01/22/2012.    Past Medical History  Diagnosis Date  . PAP SMEAR, ABNORMAL 08/23/2006  . ALLERGIC RHINITIS   . MIGRAINE, MENSTRUAL   . Morbid obesity   . Anemia   . Headache   . Asthma     as a child - no problem as adult  . Heart murmur     as a child - no problem as adult - closed per pt    Past Surgical History  Procedure Date  . Dermoid cyst  excision 08/2011    Neck    Family History  Problem Relation Age of Onset  . Breast cancer Maternal Aunt   . Cancer Maternal Aunt     breast  . Diabetes Maternal Grandmother   . Hypertension Maternal Grandmother   . Stroke Maternal Grandmother   . Hypertension Other     Social History:  reports that she has never smoked. She has never used smokeless tobacco. She reports that she drinks alcohol. She reports that she does not use illicit drugs.  Allergies:  Allergies  Allergen Reactions  . Sulfonamide Derivatives Nausea Only  . Ciprofloxacin Nausea Only and Rash  . Penicillins Nausea Only and Rash    Tolerates Amoxicillin OK    Prescriptions prior to admission  Medication Sig Dispense Refill  . ferrous fumarate (HEMOCYTE - 106 MG FE) 325 (106 FE) MG TABS Take 1 tablet by mouth daily. Uses [facility] brand.      . fluticasone (FLONASE) 50 MCG/ACT nasal spray Place 2 sprays into the nose every morning.  16 g  6  . Aspirin-Acetaminophen (GOODY BODY PAIN) 500-325 MG PACK Take by mouth as needed.          @ROS @  Blood pressure 126/88, pulse 94, temperature 98.4 F (36.9 C), temperature source Oral, resp. rate 16, height 5\' 3"  (1.6 m), weight 106.595 kg (235 lb), last menstrual period 01/22/2012,  SpO2 98.00%.  Sonohysto:  Polyp/myoma.  Results for orders placed during the hospital encounter of 02/28/12 (from the past 24 hour(s))  CBC     Status: Abnormal (Preliminary result)   Collection Time   02/28/12  8:14 AM      Component Value Range   WBC 5.9  4.0 - 10.5 K/uL   RBC 4.94  3.87 - 5.11 MIL/uL   Hemoglobin 10.1 (*) 12.0 - 15.0 g/dL   HCT 45.4 (*) 09.8 - 11.9 %   MCV 64.0 (*) 78.0 - 100.0 fL   MCH 20.4 (*) 26.0 - 34.0 pg   MCHC 32.0  30.0 - 36.0 g/dL   RDW 14.7 (*) 82.9 - 56.2 %   Platelets PENDING  150 - 400 K/uL  PREGNANCY, URINE     Status: Normal   Collection Time   02/28/12  8:14 AM      Component Value Range   Preg Test, Ur NEGATIVE  NEGATIVE    No results found.  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  Surgery and risks reviewed.   Raylen Tangonan,MARIE-LYNE 02/28/2012, 9:10 AM

## 2012-02-28 NOTE — Discharge Summary (Signed)
  Physician Discharge Summary  Patient ID: Belinda Williams MRN: [id number] DOB/AGE: Oct 20, 1975 36 y.o.  Admit date: 02/28/2012 Discharge date: 02/28/2012  Admission Diagnoses: Intrauterine lesions  Discharge Diagnoses: Intrauterine lesions        Active Problems:  * No active hospital problems. *    Discharged Condition: good  Hospital Course: Outpatient  Consults: None  Treatments: surgery: Hysteroscopy, TruClear resection of polyps and myoma, D+C  Disposition: Final discharge disposition not confirmed     Medication List     As of 02/28/2012 12:42 PM    TAKE these medications         ferrous fumarate 325 (106 FE) MG Tabs   Commonly known as: HEMOCYTE - 106 mg FE   Take 1 tablet by mouth daily. Uses [facility] brand.      fluticasone 50 MCG/ACT nasal spray   Commonly known as: FLONASE   Place 2 sprays into the nose every morning.      GOODY BODY PAIN 500-325 MG Pack   Generic drug: Aspirin-Acetaminophen   Take by mouth as needed.      oxyCODONE-acetaminophen 7.5-325 MG per tablet   Commonly known as: PERCOCET   Take 1 tablet by mouth every 4 (four) hours as needed for pain.           Follow-up Information    Follow up with Mirissa Lopresti,MARIE-LYNE, MD. In 3 weeks.   Contact information:   [address] [place] [address] [phone]          Signed: Genia Del, MD 02/28/2012, 12:42 PM

## 2012-02-28 NOTE — Op Note (Addendum)
02/28/2012  12:30 PM  PATIENT:  Belinda Williams  36 y.o. female  PRE-OPERATIVE DIAGNOSIS:  Intrauterine Lesions  POST-OPERATIVE DIAGNOSIS:  Intrauterine Lesions  PROCEDURE:  Procedure(s):  HYSTEROSCOPY RESECTION WITH TRUCLEAR, DILATION AND CURETTAGE  SURGEON:  Surgeon(s): Genia Del, MD  ASSISTANTS: none   ANESTHESIA:   general  PROCEDURE:  Under general anesthesia with endotracheal intubation the patient is in lithotomy position. She is prepped with Betadine on the suprapubic, vulvar and vaginal areas.  The bladder is catheterized. The vaginal exam reveals an anteverted uterus, no adnexal mass. She is draped as usual. A speculum is inserted in the vagina. The anterior lip of the cervix is grasped with a tenaculum.  A paracervical block is done with Nesacaine 1% a total of 20 cc at 4 and 8:00. Dilation of the cervix with Hegar dilators up to #17 without difficulty. The small Truclear hysteroscope was inserted in the intrauterine cavity.  Many intrauterine polyps are present and a submucosal myoma completely in the cavity is also seen, the myoma measures about 2.5 cm.  All the polyps were resected with the small TruClear device.  We then dilated further with Hegar dilators up to 29.  The large TruClear device for myomas was inserted in the intrauterine cavity and the myoma was resected completely.  The intrauterine cavity appeared normal with both ostia well visualized.  Pictures were taken before and after resection.  Hemostasis was adequate.  The hysteroscope was removed.  A systematic endometrial curettage of all intrauterine walls was done with the sharp curette.  The specimen were sent to pathology together.  All instruments were removed.  The patient was sent to recovery room in good and stable status.  ESTIMATED BLOOD LOSS:  50 CC FLUID DEFICIT 50 CC   Intake/Output Summary (Last 24 hours) at 02/28/12 1230 Last data filed at 02/28/12 1215  Gross per 24 hour  Intake    900 ml   Output    100 ml  Net    800 ml     BLOOD ADMINISTERED:none   LOCAL MEDICATIONS USED:  NESACAINE  SPECIMEN:  Source of Specimen:  RESECTION OF MYOMA  AND POLYPS AND ENDOMETRIAL CURETTINGS  DISPOSITION OF SPECIMEN:  PATHOLOGY  COUNTS:  YES  PLAN OF CARE: Transfer to PACU  MARIE-LYNE Jozsef Wescoat MD   02/28/2012 AT 12:32 PM

## 2012-02-28 NOTE — Anesthesia Preprocedure Evaluation (Signed)
Anesthesia Evaluation  Patient identified by MRN, date of birth, ID band Patient awake    Reviewed: Allergy & Precautions, H&P , NPO status , Patient's Chart, lab work & pertinent test results  Airway Mallampati: II TM Distance: >3 FB Neck ROM: full    Dental No notable dental hx. (+) Teeth Intact   Pulmonary    Pulmonary exam normal       Cardiovascular negative cardio ROS      Neuro/Psych    GI/Hepatic negative GI ROS, Neg liver ROS,   Endo/Other  Morbid obesity  Renal/GU negative Renal ROS  negative genitourinary   Musculoskeletal negative musculoskeletal ROS (+)   Abdominal (+) + obese,   Peds  Hematology negative hematology ROS (+)   Anesthesia Other Findings   Reproductive/Obstetrics negative OB ROS                           Anesthesia Physical Anesthesia Plan  ASA: III  Anesthesia Plan: General   Post-op Pain Management:    Induction: Intravenous  Airway Management Planned: LMA  Additional Equipment:   Intra-op Plan:   Post-operative Plan:   Informed Consent: I have reviewed the patients History and Physical, chart, labs and discussed the procedure including the risks, benefits and alternatives for the proposed anesthesia with the patient or authorized representative who has indicated his/her understanding and acceptance.     Plan Discussed with: CRNA and Surgeon  Anesthesia Plan Comments:         Anesthesia Quick Evaluation

## 2012-02-28 NOTE — Anesthesia Postprocedure Evaluation (Signed)
Anesthesia Post Note  Patient: Belinda Williams  Procedure(s) Performed: Procedure(s) (LRB): DILATATION & CURETTAGE/HYSTEROSCOPY WITH TRUCLEAR (N/A)  Anesthesia type: General  Patient location: PACU  Post pain: Pain level controlled  Post assessment: Post-op Vital signs reviewed  Last Vitals:  Filed Vitals:   02/28/12 1258  BP:   Pulse: 92  Temp:   Resp:     Post vital signs: Reviewed  Level of consciousness: sedated  Complications: No apparent anesthesia complications

## 2012-02-28 NOTE — Transfer of Care (Signed)
Immediate Anesthesia Transfer of Care Note  Patient: Belinda Williams  Procedure(s) Performed: Procedure(s) (LRB) with comments: DILATATION & CURETTAGE/HYSTEROSCOPY WITH TRUCLEAR (N/A) - TruClear morcellator.  Patient Location: PACU  Anesthesia Type:General  Level of Consciousness: awake, sedated and patient cooperative  Airway & Oxygen Therapy: Patient Spontanous Breathing and Patient connected to nasal cannula oxygen  Post-op Assessment: Report given to PACU RN and Post -op Vital signs reviewed and stable  Post vital signs: Reviewed and stable  Complications: No apparent anesthesia complications

## 2012-07-13 LAB — HM PAP SMEAR

## 2012-08-14 ENCOUNTER — Ambulatory Visit (INDEPENDENT_AMBULATORY_CARE_PROVIDER_SITE_OTHER): Payer: BC Managed Care – PPO | Admitting: Internal Medicine

## 2012-08-14 ENCOUNTER — Encounter: Payer: Self-pay | Admitting: Internal Medicine

## 2012-08-14 ENCOUNTER — Other Ambulatory Visit (INDEPENDENT_AMBULATORY_CARE_PROVIDER_SITE_OTHER): Payer: BC Managed Care – PPO

## 2012-08-14 VITALS — BP 112/84 | HR 90 | Temp 98.3°F | Wt 243.8 lb

## 2012-08-14 DIAGNOSIS — Z0000 Encounter for general adult medical examination without abnormal findings: Secondary | ICD-10-CM

## 2012-08-14 DIAGNOSIS — J309 Allergic rhinitis, unspecified: Secondary | ICD-10-CM

## 2012-08-14 LAB — CBC WITH DIFFERENTIAL/PLATELET
Basophils Absolute: 0 10*3/uL (ref 0.0–0.1)
Basophils Relative: 0.7 % (ref 0.0–3.0)
Eosinophils Absolute: 0.3 10*3/uL (ref 0.0–0.7)
HCT: 41.4 % (ref 36.0–46.0)
Hemoglobin: 13.7 g/dL (ref 12.0–15.0)
Lymphs Abs: 1.8 10*3/uL (ref 0.7–4.0)
MCHC: 33.1 g/dL (ref 30.0–36.0)
MCV: 78.5 fl (ref 78.0–100.0)
Monocytes Absolute: 0.3 10*3/uL (ref 0.1–1.0)
Neutro Abs: 2.8 10*3/uL (ref 1.4–7.7)
RBC: 5.28 Mil/uL — ABNORMAL HIGH (ref 3.87–5.11)
RDW: 19.4 % — ABNORMAL HIGH (ref 11.5–14.6)

## 2012-08-14 LAB — URINALYSIS, ROUTINE W REFLEX MICROSCOPIC
Bilirubin Urine: NEGATIVE
Ketones, ur: NEGATIVE
RBC / HPF: NONE SEEN (ref 0–?)
Specific Gravity, Urine: 1.02 (ref 1.000–1.030)
Total Protein, Urine: NEGATIVE
Urine Glucose: NEGATIVE
pH: 6 (ref 5.0–8.0)

## 2012-08-14 LAB — BASIC METABOLIC PANEL
CO2: 26 mEq/L (ref 19–32)
Calcium: 8.9 mg/dL (ref 8.4–10.5)
Chloride: 105 mEq/L (ref 96–112)
Glucose, Bld: 94 mg/dL (ref 70–99)
Sodium: 136 mEq/L (ref 135–145)

## 2012-08-14 LAB — LIPID PANEL
HDL: 37.6 mg/dL — ABNORMAL LOW (ref >39.00)
LDL Cholesterol: 117 mg/dL — ABNORMAL HIGH (ref 0–99)
Total CHOL/HDL Ratio: 4

## 2012-08-14 LAB — HEPATIC FUNCTION PANEL
AST: 16 U/L (ref 0–37)
Albumin: 3.2 g/dL — ABNORMAL LOW (ref 3.5–5.2)
Total Bilirubin: 0.4 mg/dL (ref 0.3–1.2)

## 2012-08-14 MED ORDER — FLUTICASONE PROPIONATE 50 MCG/ACT NA SUSP: 2.0000 | NASAL | Status: DC

## 2012-08-14 MED ORDER — LORATADINE 10 MG PO TABS: 10.0000 mg | ORAL_TABLET | Freq: Every day | ORAL | Status: DC | PRN

## 2012-08-14 NOTE — Patient Instructions (Signed)
It was good to see you today. We have reviewed your prior records including labs and tests today Medications reviewed and updated, begin over-the-counter Claritin or Zyrtec once daily for allergy symptoms in addition to daily Flonase spray. no other changes recommended at this time. Your prescription(s) have been submitted to your pharmacy. Please take as directed and contact our office if you believe you are having problem(s) with the medication(s). Health Maintenance reviewed - all recommended immunizations and age-appropriate screenings are up-to-date. Test(s) ordered today. Your results will be released to MyChart (or called to you) after review, usually within 72hours after test completion. If any changes need to be made, you will be notified at that same time. Work on lifestyle changes as discussed (low fat, low carb, increased protein diet; improved exercise efforts; weight loss) to control sugar, blood pressure and cholesterol levels and/or reduce risk of developing other medical problems. Look into [email or web address] or other type of food journal to assist you in this process. Please schedule followup in 1-2 years for annual medical physical and labs, call sooner if problems.  Health Maintenance, Females A healthy lifestyle and preventative care can promote health and wellness.  Maintain regular health, dental, and eye exams.  Eat a healthy diet. Foods like vegetables, fruits, whole grains, low-fat dairy products, and lean protein foods contain the nutrients you need without too many calories. Decrease your intake of foods high in solid fats, added sugars, and salt. Get information about a proper diet from your caregiver, if necessary.  Regular physical exercise is one of the most important things you can do for your health. Most adults should get at least 150 minutes of moderate-intensity exercise (any activity that increases your heart rate and causes you to sweat) each week. In addition,  most adults need muscle-strengthening exercises on 2 or more days a week.   Maintain a healthy weight. The body mass index (BMI) is a screening tool to identify possible weight problems. It provides an estimate of body fat based on height and weight. Your caregiver can help determine your BMI, and can help you achieve or maintain a healthy weight. For adults 20 years and older:  A BMI below 18.5 is considered underweight.  A BMI of 18.5 to 24.9 is normal.  A BMI of 25 to 29.9 is considered overweight.  A BMI of 30 and above is considered obese.  Maintain normal blood lipids and cholesterol by exercising and minimizing your intake of saturated fat. Eat a balanced diet with plenty of fruits and vegetables. Blood tests for lipids and cholesterol should begin at age 17 and be repeated every 5 years. If your lipid or cholesterol levels are high, you are over 50, or you are a high risk for heart disease, you may need your cholesterol levels checked more frequently.Ongoing high lipid and cholesterol levels should be treated with medicines if diet and exercise are not effective.  If you smoke, find out from your caregiver how to quit. If you do not use tobacco, do not start.  If you are pregnant, do not drink alcohol. If you are breastfeeding, be very cautious about drinking alcohol. If you are not pregnant and choose to drink alcohol, do not exceed 1 drink per day. One drink is considered to be 12 ounces (355 mL) of beer, 5 ounces (148 mL) of wine, or 1.5 ounces (44 mL) of liquor.  Avoid use of street drugs. Do not share needles with anyone. Ask for help if you need support or  instructions about stopping the use of drugs.  High blood pressure causes heart disease and increases the risk of stroke. Blood pressure should be checked at least every 1 to 2 years. Ongoing high blood pressure should be treated with medicines, if weight loss and exercise are not effective.  If you are 39 to 37 years old, ask  your caregiver if you should take aspirin to prevent strokes.  Diabetes screening involves taking a blood sample to check your fasting blood sugar level. This should be done once every 3 years, after age 44, if you are within normal weight and without risk factors for diabetes. Testing should be considered at a younger age or be carried out more frequently if you are overweight and have at least 1 risk factor for diabetes.  Breast cancer screening is essential preventative care for women. You should practice "breast self-awareness." This means understanding the normal appearance and feel of your breasts and may include breast self-examination. Any changes detected, no matter how small, should be reported to a caregiver. Women in their 88s and 30s should have a clinical breast exam (CBE) by a caregiver as part of a regular health exam every 1 to 3 years. After age 40, women should have a CBE every year. Starting at age 49, women should consider having a mammogram (breast X-ray) every year. Women who have a family history of breast cancer should talk to their caregiver about genetic screening. Women at a high risk of breast cancer should talk to their caregiver about having an MRI and a mammogram every year.  The Pap test is a screening test for cervical cancer. Women should have a Pap test starting at age 33. Between ages 61 and 46, Pap tests should be repeated every 2 years. Beginning at age 35, you should have a Pap test every 3 years as long as the past 3 Pap tests have been normal. If you had a hysterectomy for a problem that was not cancer or a condition that could lead to cancer, then you no longer need Pap tests. If you are between ages 58 and 66, and you have had normal Pap tests going back 10 years, you no longer need Pap tests. If you have had past treatment for cervical cancer or a condition that could lead to cancer, you need Pap tests and screening for cancer for at least 20 years after your  treatment. If Pap tests have been discontinued, risk factors (such as a new sexual partner) need to be reassessed to determine if screening should be resumed. Some women have medical problems that increase the chance of getting cervical cancer. In these cases, your caregiver may recommend more frequent screening and Pap tests.  The human papillomavirus (HPV) test is an additional test that may be used for cervical cancer screening. The HPV test looks for the virus that can cause the cell changes on the cervix. The cells collected during the Pap test can be tested for HPV. The HPV test could be used to screen women aged 30 years and older, and should be used in women of any age who have unclear Pap test results. After the age of 68, women should have HPV testing at the same frequency as a Pap test.  Colorectal cancer can be detected and often prevented. Most routine colorectal cancer screening begins at the age of 57 and continues through age 39. However, your caregiver may recommend screening at an earlier age if you have risk factors for colon  cancer. On a yearly basis, your caregiver may provide home test kits to check for hidden blood in the stool. Use of a small camera at the end of a tube, to directly examine the colon (sigmoidoscopy or colonoscopy), can detect the earliest forms of colorectal cancer. Talk to your caregiver about this at age 1, when routine screening begins. Direct examination of the colon should be repeated every 5 to 10 years through age 65, unless early forms of pre-cancerous polyps or small growths are found.  Hepatitis C blood testing is recommended for all people born from [birth year] through 1965 and any individual with known risks for hepatitis C.  Practice safe sex. Use condoms and avoid high-risk sexual practices to reduce the spread of sexually transmitted infections (STIs). Sexually active women aged 50 and younger should be checked for Chlamydia, which is a common sexually  transmitted infection. Older women with new or multiple partners should also be tested for Chlamydia. Testing for other STIs is recommended if you are sexually active and at increased risk.  Osteoporosis is a disease in which the bones lose minerals and strength with aging. This can result in serious bone fractures. The risk of osteoporosis can be identified using a bone density scan. Women ages 47 and over and women at risk for fractures or osteoporosis should discuss screening with their caregivers. Ask your caregiver whether you should be taking a calcium supplement or vitamin D to reduce the rate of osteoporosis.  Menopause can be associated with physical symptoms and risks. Hormone replacement therapy is available to decrease symptoms and risks. You should talk to your caregiver about whether hormone replacement therapy is right for you.  Use sunscreen with a sun protection factor (SPF) of 30 or greater. Apply sunscreen liberally and repeatedly throughout the day. You should seek shade when your shadow is shorter than you. Protect yourself by wearing long sleeves, pants, a wide-brimmed hat, and sunglasses year round, whenever you are outdoors.  Notify your caregiver of new moles or changes in moles, especially if there is a change in shape or color. Also notify your caregiver if a mole is larger than the size of a pencil eraser.  Stay current with your immunizations. Document Released: 09/14/2010 Document Revised: 05/24/2011 Document Reviewed: 09/14/2010 [facility] Patient Information 2014 [place], [place]. Hay Fever  Hay fever is a type of allergy that people have to things like grass, animals, or pollen from plants and flowers. It cannot be passed from one person to another. You cannot cure hay fever, but there are things that may help relieve your problems (symptoms). HOME CARE  Avoid the things that may be causing your problems.  Take all medicine as told by your doctor. GET HELP RIGHT AWAY  IF:  You have asthma, a cough, and you start making whistling sounds when breathing (wheezing).  Your tongue or lips are puffy (swollen).  You have trouble breathing.  You feel lightheaded or like you will pass out (faint).  You have a fever.  Your problems are getting worse and your medicine is not helping.  Your treatment was working, but your problems have come back.  You are stuffed up (congested) and have pressure in your face.  You have a headache.  You have cold sweats. MAKE SURE YOU:  Understand these instructions.  Will watch your condition.  Will get help right away if you are not doing well or get worse. Document Released: 07/01/2010 Document Revised: 05/24/2011 Document Reviewed: 07/01/2010 [employer] Patient Information 2014  [employer]. Exercise to Lose Weight Exercise and a healthy diet may help you lose weight. Your doctor may suggest specific exercises. EXERCISE IDEAS AND TIPS  Choose low-cost things you enjoy doing, such as walking, bicycling, or exercising to workout videos.  Take stairs instead of the elevator.  Walk during your lunch break.  Park your car further away from work or school.  Go to a gym or an exercise class.  Start with 5 to 10 minutes of exercise each day. Build up to 30 minutes of exercise 4 to 6 days a week.  Wear shoes with good support and comfortable clothes.  Stretch before and after working out.  Work out until you breathe harder and your heart beats faster.  Drink extra water when you exercise.  Do not do so much that you hurt yourself, feel dizzy, or get very short of breath. Exercises that burn about 150 calories:  Running 1  miles in 15 minutes.  Playing volleyball for 45 to 60 minutes.  Washing and waxing a car for 45 to 60 minutes.  Playing touch football for 45 minutes.  Walking 1  miles in 35 minutes.  Pushing a stroller 1  miles in 30 minutes.  Playing basketball for 30 minutes.  Raking  leaves for 30 minutes.  Bicycling 5 miles in 30 minutes.  Walking 2 miles in 30 minutes.  Dancing for 30 minutes.  Shoveling snow for 15 minutes.  Swimming laps for 20 minutes.  Walking up stairs for 15 minutes.  Bicycling 4 miles in 15 minutes.  Gardening for 30 to 45 minutes.  Jumping rope for 15 minutes.  Washing windows or floors for 45 to 60 minutes. Document Released: 04/03/2010 Document Revised: 05/24/2011 Document Reviewed: 04/03/2010 [facility] Patient Information 2014 [place], [place].

## 2012-08-14 NOTE — Progress Notes (Signed)
Subjective:    Patient ID: Belinda Williams, female    DOB: 1975-12-10, 37 y.o.   MRN: [id number]  HPI   patient is here today for annual physical. Patient feels well overall.  Complains of allergies  Also difficulty losing weight despite recent exercise and diet changes - ?check thyroid   Past Medical History  Diagnosis Date  . PAP SMEAR, ABNORMAL 08/23/2006  . ALLERGIC RHINITIS   . MIGRAINE, MENSTRUAL   . Morbid obesity   . Anemia   . Headache(784.0)   . Asthma     as a child - no problem as adult  . Heart murmur     as a child - no problem as adult - closed per pt  . Thyroglossal duct cyst     Excision June 2013 by ENT ([facility])   Family History  Problem Relation Age of Onset  . Breast cancer Maternal Aunt   . Cancer Maternal Aunt     breast  . Diabetes Maternal Grandmother   . Hypertension Maternal Grandmother   . Stroke Maternal Grandmother   . Hypertension Other    History  Substance Use Topics  . Smoking status: Never Smoker   . Smokeless tobacco: Never Used  . Alcohol Use: Yes     Comment: socially    Review of Systems  Constitutional: Positive for unexpected weight change. Negative for fever and fatigue.  HENT: Positive for congestion, rhinorrhea, sneezing, postnasal drip and sinus pressure. Negative for ear pain, nosebleeds, facial swelling, neck stiffness, tinnitus and ear discharge.   Respiratory: Negative for cough, shortness of breath and wheezing.   Cardiovascular: Negative for chest pain and palpitations.  Gastrointestinal: Negative for abdominal pain, no bowel changes.  Musculoskeletal: Negative for gait problem or joint swelling.  Skin: Negative for rash.  Neurological: Negative for dizziness or headache.  No other specific complaints in a complete review of systems (except as listed in HPI above).      Objective:   Physical Exam BP 112/84  Pulse 90  Temp(Src) 98.3 F (36.8 C) (Oral)  Wt 243 lb 12.8 oz (110.587 kg)  BMI 43.2 kg/m2   SpO2 96% Wt Readings from Last 3 Encounters:  08/14/12 243 lb 12.8 oz (110.587 kg)  02/15/12 235 lb (106.595 kg)  02/15/12 235 lb (106.595 kg)   Constitutional: She is overweight, but appears well-developed and well-nourished. No distress.  HENT: Head: Normocephalic and atraumatic. Ears: B TMs ok, no erythema or effusion; Nose: Nose clear rhinorrhea and mucosal pallor. Mouth/Throat: Oropharynx is clear and moist. Mild cobblestoning. No oropharyngeal exudate.  Eyes: Conjunctivae and EOM are normal. Pupils are equal, round, and reactive to light. No scleral icterus.  Neck: Normal range of motion. Neck supple. No JVD or LAD present. No thyromegaly present.  Cardiovascular: Normal rate, regular rhythm and normal heart sounds.  No murmur heard. No BLE edema. Pulmonary/Chest: Effort normal and breath sounds normal. No respiratory distress. She has no wheezes.  Skin: Skin is warm and dry. No rash noted. No erythema.  Psychiatric: She has a normal mood and affect. Her behavior is normal. Judgment and thought content normal.   Lab Results  Component Value Date   WBC 5.9 02/28/2012   HGB 10.1* 02/28/2012   HCT 31.6* 02/28/2012   PLT 348 02/28/2012   GLUCOSE 81 09/18/2010   CHOL 145 09/18/2010   TRIG 47.0 09/18/2010   HDL 38.60* 09/18/2010   LDLCALC 97 09/18/2010   ALT 10 09/18/2010   AST 14 09/18/2010  NA 140 09/18/2010   K 3.9 09/18/2010   CL 109 09/18/2010   CREATININE 0.7 09/18/2010   BUN 9 09/18/2010   CO2 28 09/18/2010   TSH 2.45 09/18/2010       Assessment & Plan:  CPX/v70.0 - Patient has been counseled on age-appropriate routine health concerns for screening and prevention. These are reviewed and up-to-date. Immunizations are up-to-date or declined. Labs ordered and reviewed.  Also see problem list. Medications and labs reviewed today.

## 2012-08-14 NOTE — Assessment & Plan Note (Signed)
Seasonal symptoms - unresponsive to OTC meds Exam/hx benign Resume nasal steroids and continue antihistamine

## 2012-08-14 NOTE — Assessment & Plan Note (Signed)
Belinda Williams trends reviewed The patient is asked to make an attempt to improve diet and exercise patterns to aid in medical management of this problem.  Check screening labs - TSH

## 2013-06-03 ENCOUNTER — Emergency Department (HOSPITAL_COMMUNITY)
Admission: EM | Admit: 2013-06-03 | Discharge: 2013-06-03 | Disposition: A | Discharge status: Home / Self Care | Payer: BC Managed Care – PPO | Source: Home / Self Care | Attending: Family Medicine | Admitting: Family Medicine

## 2013-06-03 ENCOUNTER — Encounter (HOSPITAL_COMMUNITY): Payer: Self-pay | Admitting: Emergency Medicine

## 2013-06-03 ENCOUNTER — Emergency Department (INDEPENDENT_AMBULATORY_CARE_PROVIDER_SITE_OTHER): Payer: BC Managed Care – PPO

## 2013-06-03 DIAGNOSIS — M79609 Pain in unspecified limb: Secondary | ICD-10-CM

## 2013-06-03 DIAGNOSIS — M79676 Pain in unspecified toe(s): Secondary | ICD-10-CM

## 2013-06-03 MED ORDER — COLCHICINE 0.6 MG PO TABS: 0.6000 mg | ORAL_TABLET | Freq: Two times a day (BID) | ORAL | Status: DC

## 2013-06-03 MED ORDER — DICLOFENAC POTASSIUM 50 MG PO TABS: 50.0000 mg | ORAL_TABLET | Freq: Three times a day (TID) | ORAL | Status: DC

## 2013-06-03 NOTE — ED Notes (Signed)
Patient complains of left foot pain and swelling after standing on foot all day; states she does not remember twisting ankle, but states swelling and pain.

## 2013-06-03 NOTE — ED Provider Notes (Signed)
CSN: [id number]     Arrival date & time 06/03/13  1150 History   First MD Initiated Contact with Patient 06/03/13 1237     Chief Complaint  Patient presents with  . Foot Injury   (Consider location/radiation/quality/duration/timing/severity/associated sxs/prior Treatment) Patient is a 38 y.o. female presenting with foot injury. The history is provided by the patient.  Foot Injury Location:  Toe Time since incident:  1 day Injury: no   Toe location:  L big toe Pain details:    Quality:  Throbbing, sharp and aching   Radiates to:  Does not radiate   Severity:  Moderate   Onset quality:  Sudden Chronicity:  New Dislocation: no   Foreign body present:  No foreign bodies Prior injury to area:  No Associated symptoms: swelling   Associated symptoms: no numbness   Risk factors comment:  Gmother with h/o gout.   Past Medical History  Diagnosis Date  . PAP SMEAR, ABNORMAL 08/23/2006  . ALLERGIC RHINITIS   . MIGRAINE, MENSTRUAL   . Morbid obesity   . Anemia   . Headache(784.0)   . Asthma     as a child - no problem as adult  . Heart murmur     as a child - no problem as adult - closed per pt  . Thyroglossal duct cyst     Excision June 2013 by ENT ([facility])   Past Surgical History  Procedure Laterality Date  . Dermoid cyst  excision  08/2011    Neck  . Dilatation & currettage/hysteroscopy with resectocope  02/2012   Family History  Problem Relation Age of Onset  . Breast cancer Maternal Aunt   . Cancer Maternal Aunt     breast  . Diabetes Maternal Grandmother   . Hypertension Maternal Grandmother   . Stroke Maternal Grandmother   . Hypertension Other    History  Substance Use Topics  . Smoking status: Never Smoker   . Smokeless tobacco: Never Used  . Alcohol Use: Yes     Comment: socially   OB History   Grav Para Term Preterm Abortions TAB SAB Ect Mult Living                 Review of Systems  Constitutional: Negative.   Musculoskeletal: Positive for joint  swelling. Negative for gait problem and myalgias.  Skin: Negative.     Allergies  Sulfonamide derivatives; Ciprofloxacin; and Penicillins  Home Medications   Current Outpatient Rx  Name  Route  Sig  Dispense  Refill  . norethindrone-ethinyl estradiol (JUNEL FE,GILDESS FE,LOESTRIN FE) 1-20 MG-MCG tablet   Oral   Take 1 tablet by mouth daily.         . Aspirin-Acetaminophen (GOODY BODY PAIN) 500-325 MG PACK   Oral   Take by mouth as needed.           . colchicine 0.6 MG tablet   Oral   Take 1 tablet (0.6 mg total) by mouth 2 (two) times daily.   20 tablet   0   . diclofenac (CATAFLAM) 50 MG tablet   Oral   Take 1 tablet (50 mg total) by mouth 3 (three) times daily.   30 tablet   0   . ferrous fumarate (HEMOCYTE - 106 MG FE) 325 (106 FE) MG TABS   Oral   Take 1 tablet by mouth daily. Uses [place] brand.         . fluticasone (FLONASE) 50 MCG/ACT nasal spray  Nasal   Place 2 sprays into the nose every morning.   16 g   6   . loratadine (CLARITIN) 10 MG tablet   Oral   Take 1 tablet (10 mg total) by mouth daily as needed for allergies.   30 tablet   2    BP 132/84  Pulse 81  Temp(Src) 97.2 F (36.2 C) (Oral)  Resp 18  SpO2 99% Physical Exam  Nursing note and vitals reviewed. Constitutional: She is oriented to person, place, and time. She appears well-developed and well-nourished.  Musculoskeletal: She exhibits tenderness.       Feet:  Neurological: She is alert and oriented to person, place, and time.  Skin: Skin is warm and dry.    ED Course  Procedures (including critical care time) Labs Review Labs Reviewed - No data to display Imaging Review Dg Foot Complete Left  06/03/2013   CLINICAL DATA:  Left foot pain.  No known injury.  EXAM: LEFT FOOT - COMPLETE 3+ VIEW  COMPARISON:  None.  FINDINGS: There is no evidence of acute fracture, subluxation, or dislocation.  The Lisfranc joints are intact.  No focal bony lesions are  identified.  There is no evidence of radiopaque foreign body.  The joint spaces are unremarkable.  IMPRESSION: Negative.   Electronically Signed   By: Hassan Rowan M.D.   On: 06/03/2013 13:40    X-rays reviewed and report per radiologist.   MDM   1. Pain of great toe        Billy Fischer, MD 06/03/13 2003

## 2013-07-26 LAB — LIPID PANEL
CHOLESTEROL: 154 mg/dL (ref 0–200)
HDL: 39 mg/dL (ref 35–70)
LDL CALC: 101 mg/dL
Triglycerides: 72 mg/dL (ref 40–160)

## 2013-08-07 ENCOUNTER — Encounter: Payer: Self-pay | Admitting: Internal Medicine

## 2013-10-22 ENCOUNTER — Encounter: Payer: Self-pay | Admitting: Internal Medicine

## 2013-10-22 ENCOUNTER — Ambulatory Visit (INDEPENDENT_AMBULATORY_CARE_PROVIDER_SITE_OTHER): Payer: BC Managed Care – PPO | Admitting: Internal Medicine

## 2013-10-22 ENCOUNTER — Telehealth: Payer: Self-pay | Admitting: *Deleted

## 2013-10-22 VITALS — BP 138/83 | HR 101 | Temp 98.7°F | Wt 244.5 lb

## 2013-10-22 DIAGNOSIS — G43909 Migraine, unspecified, not intractable, without status migrainosus: Secondary | ICD-10-CM

## 2013-10-22 MED ORDER — SUMATRIPTAN SUCCINATE 100 MG PO TABS: ORAL_TABLET | ORAL | Status: DC

## 2013-10-22 NOTE — Progress Notes (Signed)
Pre visit review using our clinic review tool, if applicable. No additional management support is needed unless otherwise documented below in the visit note. 

## 2013-10-22 NOTE — Telephone Encounter (Signed)
Let msg on triage requesting something to be called in for headaches. Called pt back inform her she will need appt b4 anything could be rx. Made appt with Dr. Linna Darner today @ 4:45...Belinda Williams

## 2013-10-22 NOTE — Patient Instructions (Signed)

## 2013-10-22 NOTE — Progress Notes (Signed)
Subjective:    Patient ID: Belinda Williams, female    DOB: 06-12-1975, 38 y.o.   MRN: [id number]  HPI   She has had headache in the left frontal, retro orbital, and left posterior neck since 10/19/13. It is described as sharp, up to a level IX - 10. She's been taking Goodie powders and over-the-counter allergy sinus medicine with minimal temporary response. This is associated with sensitivity to light and sound. She's had temp elevation to less than 99. She feels as if she's had some chilling and sweating Her symptoms are described as similar to her menstrual migraine except she is not having cycles because of continuous blood birth-control pills. The only trigger has been repeated air travel recently. She did question a prodrome of fatigue. There's no associated aura. Her father has history of migraines.  Review of Systems  She has no blurred vision, double vision, loss of vision. There is no excessive tearing of her eyes. She also denies ringing in the ears or sudden hearing loss. There's been no associated vertigo. She has no weakness, numbness,or tingling in her arms and legs. There is no imbalance or ataxia. She has no nasal purulence, otic pain, otic discharge. There's been no rash or change in color or temperature of the skin in the area of the headache. She has no abnormal bruising or bleeding.       Objective:   Physical Exam Gen.: Healthy and well-nourished in appearance. Alert, appropriate and cooperative throughout exam. Appears younger than stated age  Head: Normocephalic without obvious abnormalities; wearing hair extender  Eyes: No corneal or conjunctival inflammation noted. Pupils equal round reactive to light and accommodation. Extraocular motion intact. Field of  Vision grossly normal  .Vision intact w/o lenses Ears: External  ear exam reveals no significant lesions or deformities. Canals clear .TMs normal. Hearing is grossly normal bilaterally. Nose: External nasal  exam reveals no deformity or inflammation. Nasal mucosa are pink and moist. No lesions or exudates noted.   Mouth: Oral mucosa and oropharynx reveal no lesions or exudates. Teeth in good repair. Neck: No deformities, masses, or tenderness noted. Range of motion & Thyroid normal Lungs: Normal respiratory effort; chest expands symmetrically. Lungs are clear to auscultation without rales, wheezes, or increased work of breathing. Heart: Normal rate and rhythm. Normal S1 and S2. No gallop, click, or rub. No murmur. Abdomen: Bowel sounds normal; abdomen soft and nontender. No masses, organomegaly or hernias noted.                                  Musculoskeletal/extremities: No deformity or scoliosis noted of  the thoracic or lumbar spine.  No clubbing, cyanosis, edema, or significant extremity  deformity noted. Range of motion normal .Tone & strength normal. Hand joints normal Fingernail  health good. Able to lie down & sit up w/o help. Negative SLR bilaterally Vascular: Carotid, radial artery, dorsalis pedis and  posterior tibial pulses are full and equal. No bruits present. Neurologic: Alert and oriented x3. Deep tendon reflexes symmetrical and normal. No cranial nerve deficit. Gait normal  including heel & toe walking . Rhomberg & finger to nose negative     Skin: Intact without suspicious lesions or rashes. Lymph: No cervical, axillary lymphadenopathy present. Psych: Mood and affect are normal. Normally interactive  Assessment & Plan:  #1 migraine headache w/o neurologic deficit See orders & AVS

## 2013-10-23 ENCOUNTER — Telehealth: Payer: Self-pay

## 2013-10-23 NOTE — Telephone Encounter (Signed)
Received a fax from [facility] [phone] stating Sumatriptan needs prior authorization. Prior authorization has been submitted via cover my meds. Waiting on insurance response.

## 2013-10-29 NOTE — Telephone Encounter (Signed)
Received an email stating prior authorization for Sumatriptan has been approved

## 2013-10-31 ENCOUNTER — Telehealth: Payer: Self-pay

## 2013-10-31 NOTE — Telephone Encounter (Signed)
Per [facility] Sumatriptan Succinate tablet has been approved effective 10/02/2013 through 04/21/2014.

## 2014-03-20 NOTE — Telephone Encounter (Signed)
Received fax approving Sumatriptan Succinate tab from 01/2014 to 08/2014

## 2015-08-04 ENCOUNTER — Encounter: Payer: Self-pay | Admitting: Internal Medicine

## 2015-08-04 ENCOUNTER — Other Ambulatory Visit (INDEPENDENT_AMBULATORY_CARE_PROVIDER_SITE_OTHER): Payer: BC Managed Care – PPO

## 2015-08-04 ENCOUNTER — Ambulatory Visit (INDEPENDENT_AMBULATORY_CARE_PROVIDER_SITE_OTHER): Payer: BC Managed Care – PPO | Admitting: Internal Medicine

## 2015-08-04 VITALS — BP 124/76 | HR 82 | Temp 98.3°F | Ht 63.0 in | Wt 247.0 lb

## 2015-08-04 DIAGNOSIS — E042 Nontoxic multinodular goiter: Secondary | ICD-10-CM

## 2015-08-04 DIAGNOSIS — E538 Deficiency of other specified B group vitamins: Secondary | ICD-10-CM | POA: Insufficient documentation

## 2015-08-04 DIAGNOSIS — R5382 Chronic fatigue, unspecified: Secondary | ICD-10-CM

## 2015-08-04 DIAGNOSIS — Q892 Congenital malformations of other endocrine glands: Secondary | ICD-10-CM | POA: Diagnosis not present

## 2015-08-04 DIAGNOSIS — J4520 Mild intermittent asthma, uncomplicated: Secondary | ICD-10-CM | POA: Diagnosis not present

## 2015-08-04 DIAGNOSIS — G43809 Other migraine, not intractable, without status migrainosus: Secondary | ICD-10-CM

## 2015-08-04 DIAGNOSIS — J302 Other seasonal allergic rhinitis: Secondary | ICD-10-CM

## 2015-08-04 DIAGNOSIS — R5383 Other fatigue: Secondary | ICD-10-CM | POA: Insufficient documentation

## 2015-08-04 LAB — TSH: TSH: 3.49 u[IU]/mL (ref 0.35–4.50)

## 2015-08-04 LAB — COMPREHENSIVE METABOLIC PANEL
ALBUMIN: 3.4 g/dL — AB (ref 3.5–5.2)
ALT: 8 U/L (ref 0–35)
AST: 10 U/L (ref 0–37)
Alkaline Phosphatase: 71 U/L (ref 39–117)
BILIRUBIN TOTAL: 0.3 mg/dL (ref 0.2–1.2)
BUN: 10 mg/dL (ref 6–23)
CALCIUM: 8.8 mg/dL (ref 8.4–10.5)
CO2: 26 meq/L (ref 19–32)
CREATININE: 0.81 mg/dL (ref 0.40–1.20)
Chloride: 105 mEq/L (ref 96–112)
GFR: 100.92 mL/min (ref >60.00)
Glucose, Bld: 79 mg/dL (ref 70–99)
Potassium: 4.2 mEq/L (ref 3.5–5.1)
Sodium: 136 mEq/L (ref 135–145)
Total Protein: 6.9 g/dL (ref 6.0–8.3)

## 2015-08-04 LAB — VITAMIN B12: Vitamin B-12: 195 pg/mL — ABNORMAL LOW (ref 211–911)

## 2015-08-04 LAB — T4, FREE: Free T4: 0.83 ng/dL (ref 0.60–1.60)

## 2015-08-04 MED ORDER — RIZATRIPTAN BENZOATE 10 MG PO TABS: 10.0000 mg | ORAL_TABLET | ORAL | Status: DC | PRN

## 2015-08-04 NOTE — Assessment & Plan Note (Signed)
Will check thyroid function, CMP, vitamin B12 She recently had blood work done by her gynecologist-CBC and iron levels were normal

## 2015-08-04 NOTE — Patient Instructions (Signed)
  Test(s) ordered today. Your results will be released to [facility] (or called to you) after review, usually within 72hours after test completion. If any changes need to be made, you will be notified at that same time.   Medications reviewed and updated.  Changes include trial of a different migraine medication. If this does not work please let me know.   Your prescription(s) have been submitted to your pharmacy. Please take as directed and contact our office if you believe you are having problem(s) with the medication(s).  An ultrasound was ordered for your thyroid and we will call you to schedule.

## 2015-08-04 NOTE — Progress Notes (Signed)
Pre visit review using our clinic review tool, if applicable. No additional management support is needed unless otherwise documented below in the visit note. 

## 2015-08-04 NOTE — Assessment & Plan Note (Signed)
Discontinue sumatriptan since it is not always effective and causes nausea We'll try Maxalt-if too expensive we will try a different triptan Decrease use of Goody powders due to stomach upset

## 2015-08-04 NOTE — Progress Notes (Signed)
Subjective:    Patient ID: Belinda Williams, female    DOB: April 13, 1975, 40 y.o.   MRN: [id number]  HPI She is here to establish with a new pcp.  She is here for follow up.  She has some thyroid questions.  Her levels have been normal, but she is always tired. She is having trouble losing weight.  She is working with a Clinical research associate three days a week for one hour.  She gets her steps in.  She is drinking more water and trying to eat healthy.  She is frustrated with her inability to lose weight.  Migraine headaches:  She is not getting migraines as much.  They are usually associated with her menses.  She gets one at least one once a month and it lasts 2-3 days.  She takes goody powder, but it does cause stomach upset.  She takes otc medication, but it does not work well.  She was taking sumatriptan, but it was causing nausea and sometimes it would work, others would not. She stopped taking the sumatriptan. She has not taken any other medications in the family.    Asthma: She has not had any allergy symptoms and she was 40 years old. She does not take any medication as needed.  Allergic rhinitis: She is taking over-the-counter allergy medication as needed and this is controlling her allergies.  Multinodular goiter: She has not had an ultrasound in a while and wonders if she needs to have one. She has never had a biopsy. She did have a thyroglossal duct cyst that was removed in the past.  Medications and allergies reviewed with patient and updated if appropriate.  Patient Active Problem List   Diagnosis Date Noted  . 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 ALLERGIC RHINITIS 02/16/2010  . MIGRAINE, MENSTRUAL 02/24/2009  . MORBID OBESITY 04/24/2007  . ASTHMA 04/24/2007  . PAP SMEAR, ABNORMAL 08/23/2006    Current Outpatient Prescriptions on File Prior to Visit  Medication Sig Dispense Refill  . Aspirin-Acetaminophen (GOODY BODY PAIN) 500-325 MG PACK  Take by mouth as needed.      . fluticasone (FLONASE) 50 MCG/ACT nasal spray Place 2 sprays into the nose every morning. 16 g 6  . ferrous fumarate (HEMOCYTE - 106 MG FE) 325 (106 FE) MG TABS Take 1 tablet by mouth daily. Reported on 08/04/2015    . loratadine (CLARITIN) 10 MG tablet Take 1 tablet (10 mg total) by mouth daily as needed for allergies. (Patient not taking: Reported on 08/04/2015) 30 tablet 2  . norethindrone-ethinyl estradiol (JUNEL FE,GILDESS FE,LOESTRIN FE) 1-20 MG-MCG tablet Take 1 tablet by mouth daily. Reported on 08/04/2015    . SUMAtriptan (IMITREX) 100 MG tablet One pill prn headache;may repeat in 2 hours if headache persists or recurs. (Patient not taking: Reported on 08/04/2015) 10 tablet 0   No current facility-administered medications on file prior to visit.    Past Medical History  Diagnosis Date  . PAP SMEAR, ABNORMAL 08/23/2006  . ALLERGIC RHINITIS   . MIGRAINE, MENSTRUAL   . Morbid obesity ([place])   . Anemia   . Headache(784.0)   . Asthma     as a child - no problem as adult  . Heart murmur     as a child - no problem as adult - closed per pt  . Thyroglossal duct cyst     Excision June 2013 by ENT ([facility])    Past Surgical History  Procedure Laterality  Date  . Dermoid cyst  excision  08/2011    Neck  . Dilatation & currettage/hysteroscopy with resectocope  02/2012    Social History   Social History  . Marital Status: Single    Spouse Name: N/A  . Number of Children: N/A  . Years of Education: N/A   Social History Main Topics  . Smoking status: Never Smoker   . Smokeless tobacco: Never Used  . Alcohol Use: Yes     Comment: socially  . Drug Use: No  . Sexual Activity: No     Comment: Single, not sexual active at this time   Other Topics Concern  . None   Social History Narrative    Family History  Problem Relation Age of Onset  . Breast cancer Maternal Aunt   . Cancer Maternal Aunt     breast  . Diabetes Maternal Grandmother   .  Hypertension Maternal Grandmother   . Stroke Maternal Grandmother   . Hypertension Other     Review of Systems  Constitutional: Positive for fatigue. Negative for fever and chills.  Respiratory: Positive for wheezing (when extremely tired). Negative for cough and shortness of breath.   Cardiovascular: Positive for leg swelling (travel only). Negative for chest pain and palpitations.  Gastrointestinal: Negative for constipation.  Genitourinary: Negative for menstrual problem (periods regular).  Neurological: Positive for headaches (migraines ). Negative for dizziness and light-headedness.       Objective:   Filed Vitals:   08/04/15 0853  BP: 124/76  Pulse: 82  Temp: 98.3 F (36.8 C)   Filed Weights   08/04/15 0853  Weight: 247 lb (112.038 kg)   Body mass index is 43.76 kg/(m^2).   Physical Exam Constitutional: Appears well-developed and well-nourished. No distress.  Neck: Neck supple. No tracheal deviation present. Mild thyromegaly present.  No carotid bruit. No cervical adenopathy.   Cardiovascular: Normal rate, regular rhythm and normal heart sounds.   No murmur heard.  No edema Pulmonary/Chest: Effort normal and breath sounds normal. No respiratory distress. No wheezes.       Assessment & Plan:   See Problem List for Assessment and Plan of chronic medical problems.

## 2015-08-04 NOTE — Assessment & Plan Note (Addendum)
Will check thyroid function, but this has been normal in the past Continue regular exercise Stressed the importance of decreasing portions/hilar intake, which is likely the cause of her difficulty losing weight Can consider weight loss medications

## 2015-08-04 NOTE — Assessment & Plan Note (Signed)
Controlled with Claritin as needed and Flonase as needed Continue above

## 2015-08-04 NOTE — Assessment & Plan Note (Signed)
Asymptomatic, no flare since she was age 40 Allergies controlled with over-the-counter medications Currently not using any inhalers

## 2015-08-04 NOTE — Assessment & Plan Note (Signed)
Last ultrasound thousand 15, which did show some solid nodules No history of biopsy per patient We will recheck ultrasound and TFTs

## 2015-08-05 ENCOUNTER — Ambulatory Visit (INDEPENDENT_AMBULATORY_CARE_PROVIDER_SITE_OTHER): Payer: BC Managed Care – PPO

## 2015-08-05 DIAGNOSIS — E538 Deficiency of other specified B group vitamins: Secondary | ICD-10-CM

## 2015-08-05 MED ORDER — CYANOCOBALAMIN 1000 MCG/ML IJ SOLN
1000.0000 ug | Freq: Once | INTRAMUSCULAR | Status: AC
  Administered 2015-08-05: 1000 ug via INTRAMUSCULAR

## 2015-08-08 ENCOUNTER — Ambulatory Visit
Admission: RE | Admit: 2015-08-08 | Discharge: 2015-08-08 | Disposition: A | Discharge status: Home / Self Care | Payer: BC Managed Care – PPO | Source: Ambulatory Visit | Attending: Internal Medicine | Admitting: Internal Medicine

## 2015-08-08 DIAGNOSIS — E042 Nontoxic multinodular goiter: Secondary | ICD-10-CM

## 2015-08-08 DIAGNOSIS — Q892 Congenital malformations of other endocrine glands: Secondary | ICD-10-CM

## 2015-08-12 ENCOUNTER — Ambulatory Visit (INDEPENDENT_AMBULATORY_CARE_PROVIDER_SITE_OTHER): Payer: BC Managed Care – PPO

## 2015-08-12 DIAGNOSIS — E538 Deficiency of other specified B group vitamins: Secondary | ICD-10-CM

## 2015-08-12 MED ORDER — CYANOCOBALAMIN 1000 MCG/ML IJ SOLN
1000.0000 ug | Freq: Once | INTRAMUSCULAR | Status: AC
  Administered 2015-08-12: 1000 ug via INTRAMUSCULAR

## 2015-08-19 ENCOUNTER — Ambulatory Visit (INDEPENDENT_AMBULATORY_CARE_PROVIDER_SITE_OTHER): Payer: BC Managed Care – PPO

## 2015-08-19 DIAGNOSIS — E538 Deficiency of other specified B group vitamins: Secondary | ICD-10-CM | POA: Diagnosis not present

## 2015-08-19 MED ORDER — CYANOCOBALAMIN 1000 MCG/ML IJ SOLN
1000.0000 ug | Freq: Once | INTRAMUSCULAR | Status: AC
  Administered 2015-08-19: 1000 ug via INTRAMUSCULAR

## 2015-08-26 ENCOUNTER — Ambulatory Visit (INDEPENDENT_AMBULATORY_CARE_PROVIDER_SITE_OTHER): Payer: BC Managed Care – PPO

## 2015-08-26 DIAGNOSIS — E538 Deficiency of other specified B group vitamins: Secondary | ICD-10-CM

## 2015-08-26 MED ORDER — CYANOCOBALAMIN 1000 MCG/ML IJ SOLN
1000.0000 ug | Freq: Once | INTRAMUSCULAR | Status: AC
  Administered 2015-08-26: 1000 ug via INTRAMUSCULAR

## 2015-09-25 ENCOUNTER — Ambulatory Visit (INDEPENDENT_AMBULATORY_CARE_PROVIDER_SITE_OTHER): Payer: BC Managed Care – PPO

## 2015-09-25 ENCOUNTER — Ambulatory Visit: Payer: BC Managed Care – PPO

## 2015-09-25 DIAGNOSIS — E538 Deficiency of other specified B group vitamins: Secondary | ICD-10-CM | POA: Diagnosis not present

## 2015-09-25 MED ORDER — CYANOCOBALAMIN 1000 MCG/ML IJ SOLN
1000.0000 ug | Freq: Once | INTRAMUSCULAR | Status: AC
  Administered 2015-09-25: 1000 ug via INTRAMUSCULAR

## 2015-10-11 ENCOUNTER — Other Ambulatory Visit: Payer: Self-pay | Admitting: Internal Medicine

## 2015-10-13 NOTE — Telephone Encounter (Signed)
Last OV 08/04/15, last refill 08/04/15, please advise.

## 2015-10-24 ENCOUNTER — Ambulatory Visit (INDEPENDENT_AMBULATORY_CARE_PROVIDER_SITE_OTHER): Payer: BC Managed Care – PPO

## 2015-10-24 DIAGNOSIS — E538 Deficiency of other specified B group vitamins: Secondary | ICD-10-CM | POA: Diagnosis not present

## 2015-10-24 MED ORDER — CYANOCOBALAMIN 1000 MCG/ML IJ SOLN
1000.0000 ug | Freq: Once | INTRAMUSCULAR | Status: AC
  Administered 2015-10-24: 1000 ug via INTRAMUSCULAR

## 2015-11-24 ENCOUNTER — Telehealth: Payer: Self-pay

## 2015-11-24 ENCOUNTER — Ambulatory Visit (INDEPENDENT_AMBULATORY_CARE_PROVIDER_SITE_OTHER): Payer: BC Managed Care – PPO

## 2015-11-24 DIAGNOSIS — E538 Deficiency of other specified B group vitamins: Secondary | ICD-10-CM | POA: Diagnosis not present

## 2015-11-24 MED ORDER — CYANOCOBALAMIN 1000 MCG/ML IJ SOLN
1000.0000 ug | Freq: Once | INTRAMUSCULAR | Status: AC
  Administered 2015-11-24: 1000 ug via INTRAMUSCULAR

## 2015-11-24 NOTE — Progress Notes (Signed)
Injection given.   Stacy J Burns, MD  

## 2015-11-24 NOTE — Telephone Encounter (Signed)
Routing to Dr Trisha Mangle came in today (nurse visit) for b12 injection---patient states she believes this is last injection she was supposed to get and is asking what she needs to do next---I tried looking in your last office visit note---I don't think i'm seeing what instructions you will need for her to do ---please advise,thanks

## 2015-11-25 NOTE — Telephone Encounter (Signed)
Please have her start taking 1049mcg of vitamin b12 daily - otc supplement.  We will see if she can maintain her normal B12 level with oral supplementation.    Have her schedule a follow up with me in 3 months.  We will recheck her b12 level in her blood work then and determine if she needs further injections or can continue the oral supplementation.

## 2015-11-26 NOTE — Telephone Encounter (Signed)
Left message asking patient to call back to discuss lab results and dr burns instructions ---can talk with Belinda Williams

## 2015-11-27 NOTE — Telephone Encounter (Signed)
Patient called back and I have advised her of dr burns note, she has schedule 3 mo follow up for December to have b12 levels checked

## 2016-02-27 ENCOUNTER — Ambulatory Visit: Payer: BC Managed Care – PPO | Admitting: Internal Medicine

## 2016-03-16 ENCOUNTER — Encounter: Payer: Self-pay | Admitting: Internal Medicine

## 2016-03-16 ENCOUNTER — Other Ambulatory Visit (INDEPENDENT_AMBULATORY_CARE_PROVIDER_SITE_OTHER): Payer: BC Managed Care – PPO

## 2016-03-16 ENCOUNTER — Ambulatory Visit (INDEPENDENT_AMBULATORY_CARE_PROVIDER_SITE_OTHER): Payer: BC Managed Care – PPO | Admitting: Internal Medicine

## 2016-03-16 VITALS — BP 120/72 | HR 95 | Temp 98.1°F | Resp 16 | Wt 240.0 lb

## 2016-03-16 DIAGNOSIS — E538 Deficiency of other specified B group vitamins: Secondary | ICD-10-CM

## 2016-03-16 DIAGNOSIS — Z114 Encounter for screening for human immunodeficiency virus [HIV]: Secondary | ICD-10-CM | POA: Diagnosis not present

## 2016-03-16 DIAGNOSIS — G43809 Other migraine, not intractable, without status migrainosus: Secondary | ICD-10-CM

## 2016-03-16 DIAGNOSIS — R5382 Chronic fatigue, unspecified: Secondary | ICD-10-CM

## 2016-03-16 DIAGNOSIS — E042 Nontoxic multinodular goiter: Secondary | ICD-10-CM | POA: Diagnosis not present

## 2016-03-16 LAB — VITAMIN B12: VITAMIN B 12: 1410 pg/mL — AB (ref 211–911)

## 2016-03-16 LAB — TSH: TSH: 2.55 u[IU]/mL (ref 0.35–4.50)

## 2016-03-16 MED ORDER — RIZATRIPTAN BENZOATE 10 MG PO TABS: 10.0000 mg | ORAL_TABLET | ORAL | 11 refills | Status: DC | PRN

## 2016-03-16 MED ORDER — VITAMIN B-12 1000 MCG PO TABS: 1000.0000 ug | ORAL_TABLET | Freq: Every day | ORAL | Status: DC

## 2016-03-16 NOTE — Progress Notes (Signed)
Subjective:    Patient ID: Belinda Williams, female    DOB: 14-Nov-1975, 41 y.o.   MRN: [id number]  HPI The patient is here for follow up.  Migraine headaches:  Taking a maxalt as needed.  Works well. No side effects.  B12 deficiency:  She has had seven injections - last 11/24/15. She has been taking 1000 mcg of oral vitamin B12 since she started the injections.  She thinks it may have improved her energy level, but denies a big difference.   She is exercising with a trainer since July.  She thinks her fatigue level is slightly better. She is working on weight loss.    Multinodular goiter:  She denies any changes in the size of her thyroid.  She denies any difficulty swallowing.   Medications and allergies reviewed with patient and updated if appropriate.  Patient Active Problem List   Diagnosis Date Noted  . Fatigue 08/04/2015  . B12 deficiency 08/04/2015  . 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 Allergic rhinitis 02/16/2010  . Migraine 02/24/2009  . MORBID OBESITY 04/24/2007  . Asthma 04/24/2007  . PAP SMEAR, ABNORMAL 08/23/2006    Current Outpatient Prescriptions on File Prior to Visit  Medication Sig Dispense Refill  . Aspirin-Acetaminophen (GOODY BODY PAIN) 500-325 MG PACK Take by mouth as needed.      . cholecalciferol (VITAMIN D) 1000 units tablet Take 1,000 Units by mouth daily.    . fluticasone (FLONASE) 50 MCG/ACT nasal spray Place 2 sprays into the nose every morning. 16 g 6  . loratadine (CLARITIN) 10 MG tablet Take 1 tablet (10 mg total) by mouth daily as needed for allergies. 30 tablet 2  . rizatriptan (MAXALT) 10 MG tablet TAKE 1 TABLET (10 MG TOTAL) BY MOUTH AS NEEDED FOR MIGRAINE. MAY REPEAT IN 2 HOURS IF NEEDED 10 tablet 11  . SPRINTEC 28 0.25-35 MG-MCG tablet TAKE 1 TABLET BY MOUTH DAILY CONTINOUSLY  3   No current facility-administered medications on file prior to visit.     Past Medical History:  Diagnosis Date   . ALLERGIC RHINITIS   . Anemia   . Asthma    as a child - no problem as adult  . Headache(784.0)   . Heart murmur    as a child - no problem as adult - closed per pt  . MIGRAINE, MENSTRUAL   . Morbid obesity ([place])   . PAP SMEAR, ABNORMAL 08/23/2006  . Thyroglossal duct cyst    Excision June 2013 by ENT ([facility])    Past Surgical History:  Procedure Laterality Date  . DERMOID CYST  EXCISION  08/2011   Neck  . DILATATION & CURRETTAGE/HYSTEROSCOPY WITH RESECTOCOPE  02/2012    Social History   Social History  . Marital status: Single    Spouse name: N/A  . Number of children: N/A  . Years of education: N/A   Social History Main Topics  . Smoking status: Never Smoker  . Smokeless tobacco: Never Used  . Alcohol use Yes     Comment: socially  . Drug use: No  . Sexual activity: No     Comment: Single, not sexual active at this time   Other Topics Concern  . None   Social History Narrative  . None    Family History  Problem Relation Age of Onset  . Breast cancer Maternal Aunt   . Cancer Maternal Aunt     breast  . Diabetes Maternal Grandmother   .  Hypertension Maternal Grandmother   . Stroke Maternal Grandmother   . Hypertension Other   . Thyroid disease Sister     Review of Systems  Constitutional: Negative for fever.  HENT: Negative for trouble swallowing.   Respiratory: Negative for cough, shortness of breath and wheezing.   Cardiovascular: Negative for chest pain, palpitations and leg swelling.  Neurological: Negative for light-headedness, numbness and headaches.       Objective:   Vitals:   03/16/16 1535  BP: 120/72  Pulse: 95  Resp: 16  Temp: 98.1 F (36.7 C)   Wt Readings from Last 3 Encounters:  03/16/16 240 lb (108.9 kg)  08/04/15 247 lb (112 kg)  10/22/13 244 lb 8 oz (110.9 kg)   Body mass index is 42.51 kg/m.   Physical Exam    Constitutional: Appears well-developed and well-nourished. No distress.  HENT:  Head: Normocephalic and  atraumatic.  Neck: Neck supple. No tracheal deviation present. thyromegaly present.  No cervical lymphadenopathy Cardiovascular: Normal rate, regular rhythm and normal heart sounds.   No murmur heard. No carotid bruit .  No edema Pulmonary/Chest: Effort normal and breath sounds normal. No respiratory distress. No has no wheezes. No rales.  Skin: Skin is warm and dry. Not diaphoretic.  Psychiatric: Normal mood and affect. Behavior is normal.      Assessment & Plan:    See Problem List for Assessment and Plan of chronic medical problems.    FU annually for a CPE

## 2016-03-16 NOTE — Assessment & Plan Note (Signed)
Improved Continue regular exercise, weight loss efforts

## 2016-03-16 NOTE — Assessment & Plan Note (Signed)
No change in size per patient Check tsh

## 2016-03-16 NOTE — Patient Instructions (Addendum)
  Test(s) ordered today. Your results will be released to [place] (or called to you) after review, usually within 72hours after test completion. If any changes need to be made, you will be notified at that same time.  All other Health Maintenance issues reviewed.   All recommended immunizations and age-appropriate screenings are up-to-date or discussed.  No immunizations administered today.   Medications reviewed and updated.  No changes recommended at this time.  Your prescription(s) have been submitted to your pharmacy. Please take as directed and contact our office if you believe you are having problem(s) with the medication(s).   Please followup in once a year for a physical

## 2016-03-16 NOTE — Assessment & Plan Note (Signed)
maxalt works well - continue to use as needed Refill sent in today

## 2016-03-16 NOTE — Assessment & Plan Note (Signed)
Has had several injections - last 11/24/15 Taking oral B12 daily for months Check B12 level Continue oral B12 if level is normal

## 2016-03-16 NOTE — Progress Notes (Signed)
Pre visit review using our clinic review tool, if applicable. No additional management support is needed unless otherwise documented below in the visit note. 

## 2016-03-17 LAB — HIV ANTIBODY (ROUTINE TESTING W REFLEX): HIV 1&2 Ab, 4th Generation: NONREACTIVE

## 2017-06-23 ENCOUNTER — Telehealth: Payer: Self-pay

## 2017-06-23 NOTE — Telephone Encounter (Signed)
Copied from [place] [id number]. Topic: Inquiry >> Jun 22, 2017  5:27 PM Oliver Pila B wrote: Reason for CRM: pt called to get labs to check her levels, pt does not know specifically what levels she wants checked and would like a nurse to call to discuss what they feel she would need, call pt to advise  Left message asking patient to call tamara,RN at [place] [facility] office back

## 2017-07-19 ENCOUNTER — Telehealth: Payer: Self-pay

## 2017-07-19 NOTE — Telephone Encounter (Signed)
Copied from [place] [id number]. Topic: Quick Communication - Office Called Patient >> Jun 23, 2017 10:51 AM Ander Slade, RN wrote: Reason for TMY:[id number] patient's call---can talk with tamara,RN at [facility] office when she calls back >> Jul 18, 2017  3:25 PM Scherrie Gerlach wrote: Pt returning your call.  Pt states it has been a while, but could never get through. Please call back  Patient needed to schedule appts, wellness and acute visit appts have been made

## 2017-07-20 ENCOUNTER — Encounter: Payer: Self-pay | Admitting: Family

## 2017-07-20 ENCOUNTER — Other Ambulatory Visit: Payer: Self-pay | Admitting: Family

## 2017-07-20 ENCOUNTER — Other Ambulatory Visit (INDEPENDENT_AMBULATORY_CARE_PROVIDER_SITE_OTHER): Payer: BC Managed Care – PPO

## 2017-07-20 ENCOUNTER — Ambulatory Visit: Payer: BC Managed Care – PPO | Admitting: Family

## 2017-07-20 VITALS — BP 136/94 | HR 89 | Temp 98.3°F | Wt 247.0 lb

## 2017-07-20 DIAGNOSIS — R635 Abnormal weight gain: Secondary | ICD-10-CM

## 2017-07-20 DIAGNOSIS — E049 Nontoxic goiter, unspecified: Secondary | ICD-10-CM

## 2017-07-20 DIAGNOSIS — R030 Elevated blood-pressure reading, without diagnosis of hypertension: Secondary | ICD-10-CM

## 2017-07-20 LAB — CBC WITH DIFFERENTIAL/PLATELET
Basophils Absolute: 0.1 10*3/uL (ref 0.0–0.1)
Basophils Relative: 1.2 % (ref 0.0–3.0)
EOS PCT: 8.6 % — AB (ref 0.0–5.0)
Eosinophils Absolute: 0.5 10*3/uL (ref 0.0–0.7)
HEMATOCRIT: 34 % — AB (ref 36.0–46.0)
HEMOGLOBIN: 10.8 g/dL — AB (ref 12.0–15.0)
LYMPHS PCT: 32.8 % (ref 12.0–46.0)
Lymphs Abs: 1.9 10*3/uL (ref 0.7–4.0)
MCHC: 31.8 g/dL (ref 30.0–36.0)
MCV: 68.5 fl — ABNORMAL LOW (ref 78.0–100.0)
MONO ABS: 0.3 10*3/uL (ref 0.1–1.0)
Monocytes Relative: 6.1 % (ref 3.0–12.0)
NEUTROS ABS: 2.9 10*3/uL (ref 1.4–7.7)
Neutrophils Relative %: 51.3 % (ref 43.0–77.0)
Platelets: 378 10*3/uL (ref 150.0–400.0)
RBC: 4.95 Mil/uL (ref 3.87–5.11)
RDW: 21.8 % — AB (ref 11.5–15.5)
WBC: 5.7 10*3/uL (ref 4.0–10.5)

## 2017-07-20 LAB — TSH: TSH: 1.71 u[IU]/mL (ref 0.35–4.50)

## 2017-07-20 LAB — COMPREHENSIVE METABOLIC PANEL
ALT: 7 U/L (ref 0–35)
AST: 9 U/L (ref 0–37)
Albumin: 3.5 g/dL (ref 3.5–5.2)
Alkaline Phosphatase: 86 U/L (ref 39–117)
BUN: 9 mg/dL (ref 6–23)
CALCIUM: 9.2 mg/dL (ref 8.4–10.5)
CO2: 26 mEq/L (ref 19–32)
Chloride: 105 mEq/L (ref 96–112)
Creatinine, Ser: 0.85 mg/dL (ref 0.40–1.20)
GFR: 94.53 mL/min (ref >60.00)
Glucose, Bld: 98 mg/dL (ref 70–99)
Potassium: 4.6 mEq/L (ref 3.5–5.1)
Sodium: 139 mEq/L (ref 135–145)
TOTAL PROTEIN: 7.4 g/dL (ref 6.0–8.3)
Total Bilirubin: 0.3 mg/dL (ref 0.2–1.2)

## 2017-07-20 NOTE — Progress Notes (Signed)
Belinda Williams is a 42 y.o. female with the following history as recorded in EpicCare:  Patient Active Problem List   Diagnosis Date Noted  . Fatigue 08/04/2015  . B12 deficiency 08/04/2015  . 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 Allergic rhinitis 02/16/2010  . Migraine 02/24/2009  . MORBID OBESITY 04/24/2007  . Asthma 04/24/2007  . PAP SMEAR, ABNORMAL 08/23/2006    Current Outpatient Medications  Medication Sig Dispense Refill  . Aspirin-Acetaminophen (GOODY BODY PAIN) 500-325 MG PACK Take by mouth as needed.      . cholecalciferol (VITAMIN D) 1000 units tablet Take 1,000 Units by mouth daily.    . rizatriptan (MAXALT) 10 MG tablet Take 1 tablet (10 mg total) by mouth as needed for migraine. May repeat in 2 hours if needed 10 tablet 11  . vitamin B-12 (CYANOCOBALAMIN) 1000 MCG tablet Take 1 tablet (1,000 mcg total) by mouth daily.     No current facility-administered medications for this visit.     Allergies: Sulfonamide derivatives; Ciprofloxacin; and Penicillins  Past Medical History:  Diagnosis Date  . ALLERGIC RHINITIS   . Anemia   . Asthma    as a child - no problem as adult  . Headache(784.0)   . Heart murmur    as a child - no problem as adult - closed per pt  . MIGRAINE, MENSTRUAL   . Morbid obesity ([place])   . PAP SMEAR, ABNORMAL 08/23/2006  . Thyroglossal duct cyst    Excision June 2013 by ENT ([facility])    Past Surgical History:  Procedure Laterality Date  . DERMOID CYST  EXCISION  08/2011   Neck  . DILATATION & CURRETTAGE/HYSTEROSCOPY WITH RESECTOCOPE  02/2012    Family History  Problem Relation Age of Onset  . Thyroid disease Sister   . Breast cancer Maternal Aunt   . Cancer Maternal Aunt        breast  . Diabetes Maternal Grandmother   . Hypertension Maternal Grandmother   . Stroke Maternal Grandmother   . Hypertension Other   . Hypertension Mother     Social History   Tobacco Use  . Smoking status: Never  Smoker  . Smokeless tobacco: Never Used  Substance Use Topics  . Alcohol use: Yes    Comment: socially    Subjective:  Patient presents with concerns for weight gain/ swelling/ blood pressure elevation; Up 7 pounds since last office visit in 02/2017; feels like she has been eating more healthy, exercising more regularly; frustrated as to why her weight is up; admits to feeling more tired recently; sleeping approximately 6 hours per night;  known history of thyroid disease in family- sister; patient has documented goiter; notes that off OCPs x 1 year- periods are regular; Denies any chest pain, shortness of breath, blurred vision or headache. Is surprised to see her blood pressure to elevated today;    Objective:  Vitals:   07/20/17 0817  BP: (!) 136/94  Pulse: 89  Temp: 98.3 F (36.8 C)  SpO2: 97%  Weight: 247 lb (112 kg)    General: Well developed, well nourished, in no acute distress  Skin : Warm and dry.  Head: Normocephalic and atraumatic  Eyes: Sclera and conjunctiva clear; pupils round and reactive to light; extraocular movements intact  Ears: External normal; canals clear; tympanic membranes normal  Oropharynx: Pink, supple. No suspicious lesions  Neck: Supple with questionable thyromegaly, no adenopathy  Lungs: Respirations unlabored; clear to auscultation bilaterally without  wheeze, rales, rhonchi  [facility] exam: normal rate and regular rhythm.  Extremities: No edema, cyanosis, clubbing  Vessels: Symmetric bilaterally  Neurologic: Alert and oriented; speech intact; face symmetrical; moves all extremities well; CNII-XII intact without focal deficit  Assessment:  1. Weight gain   2. Elevated blood pressure reading     Plan:  Update labs today; if TSH is normal, will update thyroid ultrasound; follow-up to be determined; Also encouraged patient to consider meeting with trainer at her [facility] to evaluate exercise routine, interval training; Start checking blood pressure regularly-  may need to consider trial of HCTZ; follow-up to be determined.   No follow-ups on file.  Orders Placed This Encounter  Procedures  . CBC w/Diff    Standing Status:   Future    Standing Expiration Date:   07/20/2018  . Comp Met (CMET)    Standing Status:   Future    Standing Expiration Date:   07/20/2018  . TSH    Standing Status:   Future    Standing Expiration Date:   07/20/2018    Requested Prescriptions    No prescriptions requested or ordered in this encounter

## 2017-07-20 NOTE — Patient Instructions (Signed)
Start checking your blood pressure regularly- 2-3 x per week;  If your labs are normal, will plan to order your thyroid ultrasound;

## 2017-07-28 ENCOUNTER — Ambulatory Visit
Admission: RE | Admit: 2017-07-28 | Discharge: 2017-07-28 | Disposition: A | Discharge status: Home / Self Care | Payer: BC Managed Care – PPO | Source: Ambulatory Visit | Attending: Family | Admitting: Family

## 2017-07-28 DIAGNOSIS — E049 Nontoxic goiter, unspecified: Secondary | ICD-10-CM

## 2017-07-29 ENCOUNTER — Other Ambulatory Visit: Payer: Self-pay | Admitting: Family

## 2017-07-29 DIAGNOSIS — R221 Localized swelling, mass and lump, neck: Secondary | ICD-10-CM

## 2017-08-04 NOTE — Progress Notes (Signed)
Please review readings.  Belinda Williams~

## 2017-08-17 ENCOUNTER — Ambulatory Visit (INDEPENDENT_AMBULATORY_CARE_PROVIDER_SITE_OTHER)
Admission: RE | Admit: 2017-08-17 | Discharge: 2017-08-17 | Disposition: A | Discharge status: Home / Self Care | Payer: BC Managed Care – PPO | Source: Ambulatory Visit | Attending: Family | Admitting: Family

## 2017-08-17 DIAGNOSIS — R221 Localized swelling, mass and lump, neck: Secondary | ICD-10-CM | POA: Diagnosis not present

## 2017-08-17 MED ORDER — IOPAMIDOL (ISOVUE-300) INJECTION 61%
75.0000 mL | Freq: Once | INTRAVENOUS | Status: AC | PRN
  Administered 2017-08-17: 75 mL via INTRAVENOUS

## 2017-08-18 ENCOUNTER — Other Ambulatory Visit: Payer: Self-pay | Admitting: Family

## 2017-08-18 DIAGNOSIS — R9389 Abnormal findings on diagnostic imaging of other specified body structures: Secondary | ICD-10-CM

## 2017-08-18 MED ORDER — DOXYCYCLINE HYCLATE 100 MG PO TABS: 100.0000 mg | ORAL_TABLET | Freq: Two times a day (BID) | ORAL | 0 refills | Status: DC

## 2017-09-08 DIAGNOSIS — Z833 Family history of diabetes mellitus: Secondary | ICD-10-CM | POA: Insufficient documentation

## 2017-09-08 DIAGNOSIS — D649 Anemia, unspecified: Secondary | ICD-10-CM | POA: Insufficient documentation

## 2017-09-08 NOTE — Patient Instructions (Addendum)
Test(s) ordered today. Your results will be released to [place] (or called to you) after review, usually within 72hours after test completion. If any changes need to be made, you will be notified at that same time.  All other Health Maintenance issues reviewed.   All recommended immunizations and age-appropriate screenings are up-to-date or discussed.  Tetanus immunization administered today.   Medications reviewed and updated.  No changes recommended at this time.   Please followup in one year   Health Maintenance, Female Adopting a healthy lifestyle and getting preventive care can go a long way to promote health and wellness. Talk with your health care provider about what schedule of regular examinations is right for you. This is a good chance for you to check in with your provider about disease prevention and staying healthy. In between checkups, there are plenty of things you can do on your own. Experts have done a lot of research about which lifestyle changes and preventive measures are most likely to keep you healthy. Ask your health care provider for more information. Weight and diet Eat a healthy diet  Be sure to include plenty of vegetables, fruits, low-fat dairy products, and lean protein.  Do not eat a lot of foods high in solid fats, added sugars, or salt.  Get regular exercise. This is one of the most important things you can do for your health. ? Most adults should exercise for at least 150 minutes each week. The exercise should increase your heart rate and make you sweat (moderate-intensity exercise). ? Most adults should also do strengthening exercises at least twice a week. This is in addition to the moderate-intensity exercise.  Maintain a healthy weight  Body mass index (BMI) is a measurement that can be used to identify possible weight problems. It estimates body fat based on height and weight. Your health care provider can help determine your BMI and help you  achieve or maintain a healthy weight.  For females 85 years of age and older: ? A BMI below 18.5 is considered underweight. ? A BMI of 18.5 to 24.9 is normal. ? A BMI of 25 to 29.9 is considered overweight. ? A BMI of 30 and above is considered obese.  Watch levels of cholesterol and blood lipids  You should start having your blood tested for lipids and cholesterol at 42 years of age, then have this test every 5 years.  You may need to have your cholesterol levels checked more often if: ? Your lipid or cholesterol levels are high. ? You are older than 42 years of age. ? You are at high risk for heart disease.  Cancer screening Lung Cancer  Lung cancer screening is recommended for adults 63-58 years old who are at high risk for lung cancer because of a history of smoking.  A yearly low-dose CT scan of the lungs is recommended for people who: ? Currently smoke. ? Have quit within the past 15 years. ? Have at least a 30-pack-year history of smoking. A pack year is smoking an average of one pack of cigarettes a day for 1 year.  Yearly screening should continue until it has been 15 years since you quit.  Yearly screening should stop if you develop a health problem that would prevent you from having lung cancer treatment.  Breast Cancer  Practice breast self-awareness. This means understanding how your breasts normally appear and feel.  It also means doing regular breast self-exams. Let your health care provider know about any changes,  no matter how small.  If you are in your 20s or 30s, you should have a clinical breast exam (CBE) by a health care provider every 1-3 years as part of a regular health exam.  If you are 66 or older, have a CBE every year. Also consider having a breast X-ray (mammogram) every year.  If you have a family history of breast cancer, talk to your health care provider about genetic screening.  If you are at high risk for breast cancer, talk to your health  care provider about having an MRI and a mammogram every year.  Breast cancer gene (BRCA) assessment is recommended for women who have family members with BRCA-related cancers. BRCA-related cancers include: ? Breast. ? Ovarian. ? Tubal. ? Peritoneal cancers.  Results of the assessment will determine the need for genetic counseling and BRCA1 and BRCA2 testing.  Cervical Cancer Your health care provider may recommend that you be screened regularly for cancer of the pelvic organs (ovaries, uterus, and vagina). This screening involves a pelvic examination, including checking for microscopic changes to the surface of your cervix (Pap test). You may be encouraged to have this screening done every 3 years, beginning at age 32.  For women ages 45-65, health care providers may recommend pelvic exams and Pap testing every 3 years, or they may recommend the Pap and pelvic exam, combined with testing for human papilloma virus (HPV), every 5 years. Some types of HPV increase your risk of cervical cancer. Testing for HPV may also be done on women of any age with unclear Pap test results.  Other health care providers may not recommend any screening for nonpregnant women who are considered low risk for pelvic cancer and who do not have symptoms. Ask your health care provider if a screening pelvic exam is right for you.  If you have had past treatment for cervical cancer or a condition that could lead to cancer, you need Pap tests and screening for cancer for at least 20 years after your treatment. If Pap tests have been discontinued, your risk factors (such as having a new sexual partner) need to be reassessed to determine if screening should resume. Some women have medical problems that increase the chance of getting cervical cancer. In these cases, your health care provider may recommend more frequent screening and Pap tests.  Colorectal Cancer  This type of cancer can be detected and often  prevented.  Routine colorectal cancer screening usually begins at 42 years of age and continues through 41 years of age.  Your health care provider may recommend screening at an earlier age if you have risk factors for colon cancer.  Your health care provider may also recommend using home test kits to check for hidden blood in the stool.  A small camera at the end of a tube can be used to examine your colon directly (sigmoidoscopy or colonoscopy). This is done to check for the earliest forms of colorectal cancer.  Routine screening usually begins at age 60.  Direct examination of the colon should be repeated every 5-10 years through 42 years of age. However, you may need to be screened more often if early forms of precancerous polyps or small growths are found.  Skin Cancer  Check your skin from head to toe regularly.  Tell your health care provider about any new moles or changes in moles, especially if there is a change in a mole's shape or color.  Also tell your health care provider if you  have a mole that is larger than the size of a pencil eraser.  Always use sunscreen. Apply sunscreen liberally and repeatedly throughout the day.  Protect yourself by wearing long sleeves, pants, a wide-brimmed hat, and sunglasses whenever you are outside.  Heart disease, diabetes, and high blood pressure  High blood pressure causes heart disease and increases the risk of stroke. High blood pressure is more likely to develop in: ? People who have blood pressure in the high end of the normal range (130-139/85-89 mm Hg). ? People who are overweight or obese. ? People who are African American.  If you are 30-62 years of age, have your blood pressure checked every 3-5 years. If you are 68 years of age or older, have your blood pressure checked every year. You should have your blood pressure measured twice-once when you are at a hospital or clinic, and once when you are not at a hospital or clinic.  Record the average of the two measurements. To check your blood pressure when you are not at a hospital or clinic, you can use: ? An automated blood pressure machine at a pharmacy. ? A home blood pressure monitor.  If you are between 18 years and 40 years old, ask your health care provider if you should take aspirin to prevent strokes.  Have regular diabetes screenings. This involves taking a blood sample to check your fasting blood sugar level. ? If you are at a normal weight and have a low risk for diabetes, have this test once every three years after 42 years of age. ? If you are overweight and have a high risk for diabetes, consider being tested at a younger age or more often. Preventing infection Hepatitis B  If you have a higher risk for hepatitis B, you should be screened for this virus. You are considered at high risk for hepatitis B if: ? You were born in a country where hepatitis B is common. Ask your health care provider which countries are considered high risk. ? Your parents were born in a high-risk country, and you have not been immunized against hepatitis B (hepatitis B vaccine). ? You have HIV or AIDS. ? You use needles to inject street drugs. ? You live with someone who has hepatitis B. ? You have had sex with someone who has hepatitis B. ? You get hemodialysis treatment. ? You take certain medicines for conditions, including cancer, organ transplantation, and autoimmune conditions.  Hepatitis C  Blood testing is recommended for: ? Everyone born from [birth year] through 1965. ? Anyone with known risk factors for hepatitis C.  Sexually transmitted infections (STIs)  You should be screened for sexually transmitted infections (STIs) including gonorrhea and chlamydia if: ? You are sexually active and are younger than 42 years of age. ? You are older than 42 years of age and your health care provider tells you that you are at risk for this type of infection. ? Your sexual  activity has changed since you were last screened and you are at an increased risk for chlamydia or gonorrhea. Ask your health care provider if you are at risk.  If you do not have HIV, but are at risk, it may be recommended that you take a prescription medicine daily to prevent HIV infection. This is called pre-exposure prophylaxis (PrEP). You are considered at risk if: ? You are sexually active and do not regularly use condoms or know the HIV status of your partner(s). ? You take drugs by injection. ?  You are sexually active with a partner who has HIV.  Talk with your health care provider about whether you are at high risk of being infected with HIV. If you choose to begin PrEP, you should first be tested for HIV. You should then be tested every 3 months for as long as you are taking PrEP. Pregnancy  If you are premenopausal and you may become pregnant, ask your health care provider about preconception counseling.  If you may become pregnant, take 400 to 800 micrograms (mcg) of folic acid every day.  If you want to prevent pregnancy, talk to your health care provider about birth control (contraception). Osteoporosis and menopause  Osteoporosis is a disease in which the bones lose minerals and strength with aging. This can result in serious bone fractures. Your risk for osteoporosis can be identified using a bone density scan.  If you are 80 years of age or older, or if you are at risk for osteoporosis and fractures, ask your health care provider if you should be screened.  Ask your health care provider whether you should take a calcium or vitamin D supplement to lower your risk for osteoporosis.  Menopause may have certain physical symptoms and risks.  Hormone replacement therapy may reduce some of these symptoms and risks. Talk to your health care provider about whether hormone replacement therapy is right for you. Follow these instructions at home:  Schedule regular health, dental,  and eye exams.  Stay current with your immunizations.  Do not use any tobacco products including cigarettes, chewing tobacco, or electronic cigarettes.  If you are pregnant, do not drink alcohol.  If you are breastfeeding, limit how much and how often you drink alcohol.  Limit alcohol intake to no more than 1 drink per day for nonpregnant women. One drink equals 12 ounces of beer, 5 ounces of wine, or 1 ounces of hard liquor.  Do not use street drugs.  Do not share needles.  Ask your health care provider for help if you need support or information about quitting drugs.  Tell your health care provider if you often feel depressed.  Tell your health care provider if you have ever been abused or do not feel safe at home. This information is not intended to replace advice given to you by your health care provider. Make sure you discuss any questions you have with your health care provider. Document Released: 09/14/2010 Document Revised: 08/07/2015 Document Reviewed: 12/03/2014 Elsevier Interactive Patient Education  [employer].

## 2017-09-08 NOTE — Progress Notes (Signed)
Subjective:    Patient ID: Belinda Williams, female    DOB: [birth year], 42 y.o.   MRN: [id number]  HPI She is here for a physical exam.   She has been very tired and has gained weight.  She has been trying to lose weight and keeps gaining.    [facility] [employer] and does [employer] 3/week, 2/week she does water classes.  Her weight has increased.   She has heavy menses.  She started taking iron pills after her recent blood work showed that she was anemic.  She still feels tired.  She is taking her vitamin D and vitamin B12 supplements daily.  She was here to see Mickel Baas recently for work-up of her fatigue and weight gain and thyroid ultrasound was ordered to follow-up on her thyroid nodules.  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  She was referred to ENT, but unfortunately he did not feel that this was his area and he referred her to a Psychologist, sport and exercise.  She will see the surgeon next month, but she was frustrated that she saw someone she did not need to see.  She denies any sore throats or difficulty swallowing.  Medications and allergies reviewed with patient and updated if appropriate.  Patient Active Problem List   Diagnosis Date Noted  . Anemia 09/08/2017  . Family history of diabetes mellitus in grandmother 09/08/2017  . Fatigue 08/04/2015  . B12 deficiency 08/04/2015  . 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 Allergic rhinitis 02/16/2010  . Migraine 02/24/2009  . MORBID OBESITY 04/24/2007  . Asthma 04/24/2007  . PAP SMEAR, ABNORMAL 08/23/2006    Current Outpatient Medications on File Prior to Visit  Medication Sig Dispense Refill  . Aspirin-Acetaminophen (GOODY BODY PAIN) 500-325 MG PACK Take by mouth as needed.      . cholecalciferol (VITAMIN D) 1000 units  tablet Take 1,000 Units by mouth daily.    . IRON PO Take by mouth daily.    . rizatriptan (MAXALT) 10 MG tablet Take 1 tablet (10 mg total) by mouth as needed for migraine. May repeat in 2 hours if needed 10 tablet 11  . vitamin B-12 (CYANOCOBALAMIN) 1000 MCG tablet Take 1 tablet (1,000 mcg total) by mouth daily.     No current facility-administered medications on file prior to visit.     Past Medical History:  Diagnosis Date  . ALLERGIC RHINITIS   . Anemia   . Asthma    as a child - no problem as adult  . Headache(784.0)   . Heart murmur    as a child - no problem as adult - closed per pt  . MIGRAINE, MENSTRUAL   . Morbid obesity ([place])   . PAP SMEAR, ABNORMAL 08/23/2006  . Thyroglossal duct cyst    Excision June 2013 by ENT (bates)    Past Surgical History:  Procedure Laterality Date  . DERMOID CYST  EXCISION  08/2011   Neck  . DILATATION & CURRETTAGE/HYSTEROSCOPY WITH RESECTOCOPE  02/2012    Social History   Socioeconomic History  . Marital status: Single    Spouse name: Not on file  . Number of children: Not on file  . Years of education: Not on file  . Highest education level: Not on file  Occupational  History  . Not on file  Social Needs  . Financial resource strain: Not on file  . Food insecurity:    Worry: Not on file    Inability: Not on file  . Transportation needs:    Medical: Not on file    Non-medical: Not on file  Tobacco Use  . Smoking status: Never Smoker  . Smokeless tobacco: Never Used  Substance and Sexual Activity  . Alcohol use: Yes    Comment: socially  . Drug use: No  . Sexual activity: Never    Birth control/protection: None    Comment: Single, not sexual active at this time  Lifestyle  . Physical activity:    Days per week: Not on file    Minutes per session: Not on file  . Stress: Not on file  Relationships  . Social connections:    Talks on phone: Not on file    Gets together: Not on file    Attends religious service: Not on  file    Active member of club or organization: Not on file    Attends meetings of clubs or organizations: Not on file    Relationship status: Not on file  Other Topics Concern  . Not on file  Social History Narrative  . Not on file    Family History  Problem Relation Age of Onset  . Thyroid disease Sister   . Breast cancer Maternal Aunt   . Cancer Maternal Aunt        breast  . Diabetes Maternal Grandmother   . Hypertension Maternal Grandmother   . Stroke Maternal Grandmother   . Hypertension Other   . Hypertension Mother     Review of Systems  Constitutional: Negative for chills and fever.  Eyes: Negative for visual disturbance.  Respiratory: Negative for cough, shortness of breath and wheezing.   Cardiovascular: Negative for chest pain, palpitations and leg swelling.  Gastrointestinal: Negative for abdominal pain, blood in stool, constipation, diarrhea and nausea.  Genitourinary: Negative for dysuria and hematuria.  Musculoskeletal: Negative for arthralgias and back pain.  Skin: Negative for color change and rash.  Neurological: Negative for dizziness, light-headedness and headaches.  Psychiatric/Behavioral: Negative for dysphoric mood. The patient is not nervous/anxious.        Objective:   Vitals:   09/09/17 0829  BP: 130/86  Pulse: 84  Resp: 16  Temp: 98.3 F (36.8 C)  SpO2: 99%   Filed Weights   09/09/17 0829  Weight: 250 lb (113.4 kg)   Body mass index is 44.29 kg/m.  BP Readings from Last 3 Encounters:  09/09/17 130/86  07/20/17 (!) 136/94  03/16/16 120/72    Wt Readings from Last 3 Encounters:  09/09/17 250 lb (113.4 kg)  07/20/17 247 lb (112 kg)  03/16/16 240 lb (108.9 kg)     Physical Exam Constitutional: She appears well-developed and well-nourished. No distress.  HENT:  Head: Normocephalic and atraumatic.  Right Ear: External ear normal. Normal ear canal and TM Left Ear: External ear normal.  Normal ear canal and TM Mouth/Throat:  Oropharynx is clear and moist.  Eyes: Conjunctivae and EOM are normal.  Neck: Neck supple. No tracheal deviation present. No thyromegaly present.  No carotid bruit  Cardiovascular: Normal rate, regular rhythm and normal heart sounds.   No murmur heard.  No edema. Pulmonary/Chest: Effort normal and breath sounds normal. No respiratory distress. She has no wheezes. She has no rales.  Breast: deferred to Gyn Abdominal: Soft. She exhibits no  distension. There is no tenderness.  Lymphadenopathy: She has no cervical adenopathy.  Skin: Skin is warm and dry. She is not diaphoretic.  Psychiatric: She has a normal mood and affect. Her behavior is normal.        Assessment & Plan:   Physical exam: Screening blood work  ordered Immunizations    tdap given today Mammogram     up-to-date Gyn       Up to date  Exercise exercising regularly Weight   discussed her weight at length.  She is trying to lose weight and is very frustrated that she is not able to lose weight.  She is exercising regularly.  Most likely she is consuming too many calories.  Discussed the weight management clinic.  Discussed calorie counting Skin    no concerns Substance abuse  none  See Problem List for Assessment and Plan of chronic medical problems.

## 2017-09-09 ENCOUNTER — Ambulatory Visit (INDEPENDENT_AMBULATORY_CARE_PROVIDER_SITE_OTHER): Payer: BC Managed Care – PPO | Admitting: Internal Medicine

## 2017-09-09 ENCOUNTER — Other Ambulatory Visit (INDEPENDENT_AMBULATORY_CARE_PROVIDER_SITE_OTHER): Payer: BC Managed Care – PPO

## 2017-09-09 ENCOUNTER — Encounter: Payer: Self-pay | Admitting: Internal Medicine

## 2017-09-09 VITALS — BP 130/86 | HR 84 | Temp 98.3°F | Resp 16 | Ht 63.0 in | Wt 250.0 lb

## 2017-09-09 DIAGNOSIS — G43809 Other migraine, not intractable, without status migrainosus: Secondary | ICD-10-CM | POA: Diagnosis not present

## 2017-09-09 DIAGNOSIS — Z0000 Encounter for general adult medical examination without abnormal findings: Secondary | ICD-10-CM

## 2017-09-09 DIAGNOSIS — Z23 Encounter for immunization: Secondary | ICD-10-CM

## 2017-09-09 DIAGNOSIS — D649 Anemia, unspecified: Secondary | ICD-10-CM

## 2017-09-09 DIAGNOSIS — E042 Nontoxic multinodular goiter: Secondary | ICD-10-CM

## 2017-09-09 DIAGNOSIS — E538 Deficiency of other specified B group vitamins: Secondary | ICD-10-CM

## 2017-09-09 DIAGNOSIS — Z833 Family history of diabetes mellitus: Secondary | ICD-10-CM | POA: Diagnosis not present

## 2017-09-09 LAB — CBC WITH DIFFERENTIAL/PLATELET
BASOS PCT: 0.9 % (ref 0.0–3.0)
Basophils Absolute: 0.1 10*3/uL (ref 0.0–0.1)
Eosinophils Absolute: 0.4 10*3/uL (ref 0.0–0.7)
Eosinophils Relative: 6.6 % — ABNORMAL HIGH (ref 0.0–5.0)
HCT: 33.3 % — ABNORMAL LOW (ref 36.0–46.0)
Hemoglobin: 10.7 g/dL — ABNORMAL LOW (ref 12.0–15.0)
LYMPHS ABS: 1.9 10*3/uL (ref 0.7–4.0)
Lymphocytes Relative: 33.3 % (ref 12.0–46.0)
MCHC: 32.1 g/dL (ref 30.0–36.0)
MCV: 71 fl — AB (ref 78.0–100.0)
MONO ABS: 0.3 10*3/uL (ref 0.1–1.0)
MONOS PCT: 5 % (ref 3.0–12.0)
NEUTROS ABS: 3.1 10*3/uL (ref 1.4–7.7)
NEUTROS PCT: 54.2 % (ref 43.0–77.0)
Platelets: 322 10*3/uL (ref 150.0–400.0)
RBC: 4.69 Mil/uL (ref 3.87–5.11)
RDW: 23.6 % — AB (ref 11.5–15.5)
WBC: 5.8 10*3/uL (ref 4.0–10.5)

## 2017-09-09 LAB — COMPREHENSIVE METABOLIC PANEL
ALK PHOS: 88 U/L (ref 39–117)
ALT: 5 U/L (ref 0–35)
AST: 7 U/L (ref 0–37)
Albumin: 3.6 g/dL (ref 3.5–5.2)
BUN: 12 mg/dL (ref 6–23)
CO2: 25 meq/L (ref 19–32)
Calcium: 8.8 mg/dL (ref 8.4–10.5)
Chloride: 105 mEq/L (ref 96–112)
Creatinine, Ser: 0.77 mg/dL (ref 0.40–1.20)
GFR: 105.88 mL/min (ref >60.00)
GLUCOSE: 94 mg/dL (ref 70–99)
POTASSIUM: 3.9 meq/L (ref 3.5–5.1)
SODIUM: 139 meq/L (ref 135–145)
TOTAL PROTEIN: 7 g/dL (ref 6.0–8.3)
Total Bilirubin: 0.3 mg/dL (ref 0.2–1.2)

## 2017-09-09 LAB — LIPID PANEL
CHOL/HDL RATIO: 4
Cholesterol: 153 mg/dL (ref 0–200)
HDL: 41.8 mg/dL (ref >39.00)
LDL Cholesterol: 101 mg/dL — ABNORMAL HIGH (ref 0–99)
NonHDL: 111.46
Triglycerides: 54 mg/dL (ref 0.0–149.0)
VLDL: 10.8 mg/dL (ref 0.0–40.0)

## 2017-09-09 LAB — IRON,TIBC AND FERRITIN PANEL
%SAT: 5 % (calc) — ABNORMAL LOW (ref 16–45)
FERRITIN: 13 ng/mL — AB (ref 16–232)
Iron: 18 ug/dL — ABNORMAL LOW (ref 40–190)
TIBC: 343 ug/dL (ref 250–450)

## 2017-09-09 LAB — TSH: TSH: 2.81 u[IU]/mL (ref 0.35–4.50)

## 2017-09-09 LAB — HEMOGLOBIN A1C: HEMOGLOBIN A1C: 5.5 % (ref 4.6–6.5)

## 2017-09-09 NOTE — Assessment & Plan Note (Signed)
Had recent ultrasound 08/2017-no change in nodules

## 2017-09-09 NOTE — Assessment & Plan Note (Signed)
Check A1c. 

## 2017-09-09 NOTE — Assessment & Plan Note (Signed)
Taking B12 daily Continue 

## 2017-09-09 NOTE — Assessment & Plan Note (Addendum)
She has 2 migraines a month at the most Takes Maxalt as needed, which is very effective Continue

## 2017-09-09 NOTE — Assessment & Plan Note (Signed)
Started iron pills earlier this month Recheck CBC, iron panel Continue oral iron-hopefully continuing to take this will improve her energy level

## 2017-09-09 NOTE — Assessment & Plan Note (Signed)
Discussed weight loss at length She is exercising regularly and is trying to lose weight She is very frustrated by her inability to lose weight and weight gain Discussed weight management clinic-she will let me know if she wants a referral Discussed that she most likely is consuming too many calories although she may be eating healthy-encouraged counting calories for short time.

## 2017-09-13 ENCOUNTER — Telehealth: Payer: Self-pay | Admitting: Emergency Medicine

## 2017-09-13 DIAGNOSIS — E6609 Other obesity due to excess calories: Secondary | ICD-10-CM

## 2017-09-13 DIAGNOSIS — D509 Iron deficiency anemia, unspecified: Secondary | ICD-10-CM

## 2017-09-13 NOTE — Telephone Encounter (Signed)
Labs signed. Referral ordered.

## 2017-09-13 NOTE — Telephone Encounter (Signed)
Repeat labs pending. Not sure if these are correct. Pt also would like referral placed for weight management.

## 2017-12-13 ENCOUNTER — Encounter (INDEPENDENT_AMBULATORY_CARE_PROVIDER_SITE_OTHER): Payer: Self-pay

## 2017-12-22 ENCOUNTER — Ambulatory Visit (INDEPENDENT_AMBULATORY_CARE_PROVIDER_SITE_OTHER): Payer: BC Managed Care – PPO | Admitting: Bariatrics

## 2017-12-22 ENCOUNTER — Encounter (INDEPENDENT_AMBULATORY_CARE_PROVIDER_SITE_OTHER): Payer: Self-pay | Admitting: Bariatrics

## 2017-12-22 VITALS — BP 119/86 | HR 85 | Temp 97.6°F | Ht 63.0 in | Wt 245.0 lb

## 2017-12-22 DIAGNOSIS — M545 Low back pain, unspecified: Secondary | ICD-10-CM

## 2017-12-22 DIAGNOSIS — Z0289 Encounter for other administrative examinations: Secondary | ICD-10-CM

## 2017-12-22 DIAGNOSIS — R0602 Shortness of breath: Secondary | ICD-10-CM | POA: Diagnosis not present

## 2017-12-22 DIAGNOSIS — Z1331 Encounter for screening for depression: Secondary | ICD-10-CM | POA: Diagnosis not present

## 2017-12-22 DIAGNOSIS — E559 Vitamin D deficiency, unspecified: Secondary | ICD-10-CM

## 2017-12-22 DIAGNOSIS — G8929 Other chronic pain: Secondary | ICD-10-CM

## 2017-12-22 DIAGNOSIS — Z9189 Other specified personal risk factors, not elsewhere classified: Secondary | ICD-10-CM | POA: Diagnosis not present

## 2017-12-22 DIAGNOSIS — Z6841 Body Mass Index (BMI) 40.0 and over, adult: Secondary | ICD-10-CM

## 2017-12-22 DIAGNOSIS — E538 Deficiency of other specified B group vitamins: Secondary | ICD-10-CM | POA: Diagnosis not present

## 2017-12-22 DIAGNOSIS — R5383 Other fatigue: Secondary | ICD-10-CM | POA: Diagnosis not present

## 2017-12-23 LAB — COMPREHENSIVE METABOLIC PANEL
ALT: 10 IU/L (ref 0–32)
AST: 9 IU/L (ref 0–40)
Albumin/Globulin Ratio: 1.3 (ref 1.2–2.2)
Albumin: 3.9 g/dL (ref 3.5–5.5)
Alkaline Phosphatase: 96 IU/L (ref 39–117)
BILIRUBIN TOTAL: 0.4 mg/dL (ref 0.0–1.2)
BUN/Creatinine Ratio: 11 (ref 9–23)
BUN: 10 mg/dL (ref 6–24)
CHLORIDE: 101 mmol/L (ref 96–106)
CO2: 22 mmol/L (ref 20–29)
Calcium: 9.5 mg/dL (ref 8.7–10.2)
Creatinine, Ser: 0.89 mg/dL (ref 0.57–1.00)
GFR calc non Af Amer: 81 mL/min/{1.73_m2} (ref >59)
GFR, EST AFRICAN AMERICAN: 93 mL/min/{1.73_m2} (ref >59)
Globulin, Total: 3.1 g/dL (ref 1.5–4.5)
Glucose: 92 mg/dL (ref 65–99)
POTASSIUM: 4.7 mmol/L (ref 3.5–5.2)
Sodium: 139 mmol/L (ref 134–144)
TOTAL PROTEIN: 7 g/dL (ref 6.0–8.5)

## 2017-12-23 LAB — VITAMIN D 25 HYDROXY (VIT D DEFICIENCY, FRACTURES): Vit D, 25-Hydroxy: 26 ng/mL — ABNORMAL LOW (ref 30.0–100.0)

## 2017-12-23 LAB — HEMOGLOBIN A1C
Est. average glucose Bld gHb Est-mCnc: 108 mg/dL
Hgb A1c MFr Bld: 5.4 % (ref 4.8–5.6)

## 2017-12-23 LAB — INSULIN, RANDOM: INSULIN: 17 u[IU]/mL (ref 2.6–24.9)

## 2017-12-23 LAB — VITAMIN B12: Vitamin B-12: >2000 pg/mL — ABNORMAL HIGH (ref 232–1245)

## 2017-12-26 NOTE — Progress Notes (Signed)
Office: [phone]  /  Fax: [phone]   Dear Dr. Quay Williams,   Thank you for referring Belinda Williams to our clinic. The following note includes my evaluation and treatment recommendations.  HPI:   Chief Complaint: OBESITY    Belinda Williams has been referred by Belinda Rail, MD for consultation regarding her obesity and obesity related comorbidities.    Belinda Williams (MR# [id number]) is a 42 y.o. female who presents on 12/26/2017 for obesity evaluation and treatment. Current BMI is Body mass index is 43.4 kg/m.Marland Kitchen Belinda Williams has been struggling with her weight for many years and has been unsuccessful in either losing weight, maintaining weight loss, or reaching her healthy weight goal.     Belinda Williams attended our information session and states she is currently in the action stage of change and ready to dedicate time achieving and maintaining a healthier weight. Belinda Williams is interested in becoming our patient and working on intensive lifestyle modifications including (but not limited to) diet, exercise and weight loss.    Belinda Williams states her desired weight loss is 80 lbs she has been heavy most of  her life she started gaining weight since going to college her heaviest weight ever was 250 lbs. she has significant food cravings issues  she snacks frequently at night (ice cream, cookies) she wakes up frequently in the middle of the night to eat she skips breakfast frequently she is frequently drinking liquids with calories she frequently makes poor food choices she has problems with excessive hunger  She struggles with large portion size she has binge eating behaviors she struggles with emotional eating    Belinda Williams feels her energy is lower than it should be. This has worsened with weight gain and has not worsened recently. Belinda Williams admits to daytime somnolence and admits to waking up still tired. Patient is at risk for obstructive sleep apnea. Patent has a history of symptoms  of daytime Belinda, morning Belinda and morning headache. Patient generally gets 6 hours of sleep per night, and states they generally have restful sleep. Snoring is present. Apneic episodes are not present. Epworth Sleepiness Score is 6  Dyspnea on exertion Belinda Williams notes increasing shortness of breath with exercising and seems to be worsening over time with weight gain. She notes getting out of breath sooner with activity than she used to. This has not gotten worse recently. Belinda Williams has a history of asthma (childhood) and she does not use a rescue inhaler. Belinda Williams denies orthopnea.  Vitamin D deficiency Belinda Williams has a diagnosis of vitamin D deficiency. She is not currently taking vit D and denies nausea, vomiting or muscle weakness.  At risk for osteopenia and osteoporosis Belinda Williams is at higher risk of osteopenia and osteoporosis due to vitamin D deficiency.   Lower Back Pain Belinda Williams has intermittent pain, which is worse with standing on concrete. She denies any radiation or changes in exercise.  Vitamin B12 deficiency Belinda Williams has a diagnosis of B12 insufficiency and notes Belinda. This is not a new diagnosis. Belinda Williams is not a vegetarian and does not have a previous diagnosis of pernicious anemia. She does not have a history of weight loss surgery. Belinda Williams denies paresthesias.  Depression Screen Belinda Williams Food and Mood (modified PHQ-9) score was  Depression screen PHQ 2/9 12/22/2017  Decreased Interest 1  Down, Depressed, Hopeless 0  PHQ - 2 Score 1  Altered sleeping 0  Tired, decreased energy 0  Change in appetite 3  Feeling bad or failure about yourself  2  Trouble concentrating  1  Moving slowly or fidgety/restless 0  Suicidal thoughts 0  PHQ-9 Score 7  Difficult doing work/chores Not difficult at all    ALLERGIES: Allergies  Allergen Reactions  . Sulfonamide Derivatives Nausea Only  . Ciprofloxacin Nausea Only and Rash  . Penicillins Nausea Only and Rash     Tolerates Amoxicillin OK    MEDICATIONS: Current Outpatient Medications on File Prior to Visit  Medication Sig Dispense Refill  . Aspirin-Acetaminophen (GOODY BODY PAIN) 500-325 MG PACK Take by mouth as needed.      . cholecalciferol (VITAMIN D) 1000 units tablet Take 1,000 Units by mouth daily.    . rizatriptan (MAXALT) 10 MG tablet Take 1 tablet (10 mg total) by mouth as needed for migraine. May repeat in 2 hours if needed 10 tablet 11  . vitamin B-12 (CYANOCOBALAMIN) 1000 MCG tablet Take 1 tablet (1,000 mcg total) by mouth daily.     No current facility-administered medications on file prior to visit.     PAST MEDICAL HISTORY: Past Medical History:  Diagnosis Date  . ALLERGIC RHINITIS   . Anemia   . Asthma    as a child - no problem as adult  . Headache(784.0)   . Heart murmur    as a child - no problem as adult - closed per pt  . Lactose intolerance   . Lower back pain   . MIGRAINE, MENSTRUAL   . Morbid obesity ([place])   . PAP SMEAR, ABNORMAL 08/23/2006  . Thyroglossal duct cyst    Excision June 2013 by ENT ([facility])  . Vitamin B 12 deficiency   . Vitamin D deficiency   . Weight gain     PAST SURGICAL HISTORY: Past Surgical History:  Procedure Laterality Date  . DERMOID CYST  EXCISION  08/2011   Neck  . DILATATION & CURRETTAGE/HYSTEROSCOPY WITH RESECTOCOPE  02/2012  . thyroductal cyst    . vaginal cyst removed      SOCIAL HISTORY: Social History   Tobacco Use  . Smoking status: Never Smoker  . Smokeless tobacco: Never Used  Substance Use Topics  . Alcohol use: Yes    Comment: socially  . Drug use: No    FAMILY HISTORY: Family History  Problem Relation Age of Onset  . Thyroid disease Sister   . Breast cancer Maternal Aunt   . Cancer Maternal Aunt        breast  . Diabetes Maternal Grandmother   . Hypertension Maternal Grandmother   . Stroke Maternal Grandmother   . Hypertension Other   . Hypertension Mother   . Sleep apnea Father   . Obesity Father      ROS: Review of Systems  Constitutional: Positive for malaise/Belinda.  Eyes:       + Wear Glasses or Contacts  Respiratory: Positive for shortness of breath (on exertion).   Cardiovascular: Negative for orthopnea.  Gastrointestinal: Negative for nausea and vomiting.  Musculoskeletal:       Negative for muscle weakness  Neurological: Positive for headaches.  Endo/Heme/Allergies:       + Polyphagia  Psychiatric/Behavioral:       + Stress    PHYSICAL EXAM: Blood pressure 119/86, pulse 85, temperature 97.6 F (36.4 C), temperature source Oral, height 5\' 3"  (1.6 m), weight 245 lb (111.1 kg), SpO2 100 %. Body mass index is 43.4 kg/m. Physical Exam  Constitutional: She appears well-developed and well-nourished.  HENT:  Head: Normocephalic and atraumatic.  Nose: Nose normal.  M[place]anpati = 4  Eyes: EOM are normal. No scleral icterus.  Neck: Normal range of motion. Neck supple. No thyromegaly present.  Cardiovascular: Normal rate and regular rhythm.  Pulmonary/Chest: Effort normal. No respiratory distress.  Abdominal: Soft. There is no tenderness.  + Obesity  Musculoskeletal: Normal range of motion. She exhibits edema (trace edema bilateral lower extremities).  Range of motion normal in all 4 extremities  Neurological: She is alert. Coordination normal.  Skin: Skin is warm and dry.  Psychiatric: She has a normal mood and affect. Her behavior is normal.  Vitals reviewed.   RECENT LABS AND TESTS: BMET    Component Value Date/Time   NA 139 12/22/2017 0857   K 4.7 12/22/2017 0857   CL 101 12/22/2017 0857   CO2 22 12/22/2017 0857   GLUCOSE 92 12/22/2017 0857   GLUCOSE 94 09/09/2017 0930   GLUCOSE 82 01/25/2006 1212   BUN 10 12/22/2017 0857   CREATININE 0.89 12/22/2017 0857   CALCIUM 9.5 12/22/2017 0857   GFRNONAA 81 12/22/2017 0857   GFRAA 93 12/22/2017 0857   Lab Results  Component Value Date   HGBA1C 5.4 12/22/2017   Lab Results  Component Value Date    INSULIN 17.0 12/22/2017   CBC    Component Value Date/Time   WBC 5.8 09/09/2017 0930   RBC 4.69 09/09/2017 0930   HGB 10.7 (L) 09/09/2017 0930   HCT 33.3 (L) 09/09/2017 0930   PLT 322.0 09/09/2017 0930   MCV 71.0 (L) 09/09/2017 0930   MCH 20.4 (L) 02/28/2012 0814   MCHC 32.1 09/09/2017 0930   RDW 23.6 (H) 09/09/2017 0930   LYMPHSABS 1.9 09/09/2017 0930   MONOABS 0.3 09/09/2017 0930   EOSABS 0.4 09/09/2017 0930   BASOSABS 0.1 09/09/2017 0930   Iron/TIBC/Ferritin/ %Sat    Component Value Date/Time   IRON 18 (L) 09/09/2017 0930   TIBC 343 09/09/2017 0930   FERRITIN 13 (L) 09/09/2017 0930   IRONPCTSAT 5 (L) 09/09/2017 0930   Lipid Panel     Component Value Date/Time   CHOL 153 09/09/2017 0930   TRIG 54.0 09/09/2017 0930   TRIG 44 01/25/2006 1212   HDL 41.80 09/09/2017 0930   CHOLHDL 4 09/09/2017 0930   VLDL 10.8 09/09/2017 0930   LDLCALC 101 (H) 09/09/2017 0930   Hepatic Function Panel     Component Value Date/Time   PROT 7.0 12/22/2017 0857   ALBUMIN 3.9 12/22/2017 0857   AST 9 12/22/2017 0857   ALT 10 12/22/2017 0857   ALKPHOS 96 12/22/2017 0857   BILITOT 0.4 12/22/2017 0857   BILIDIR 0.0 08/14/2012 0929      Component Value Date/Time   TSH 2.81 09/09/2017 0930   TSH 1.71 07/20/2017 0845   TSH 2.55 03/16/2016 1605    ECG  shows NSR with a rate of 86 BPM INDIRECT CALORIMETER done today shows a VO2 of 253 and a REE of 1762.  Her calculated basal metabolic rate is 0737 thus her basal metabolic rate is worse than expected.    ASSESSMENT AND PLAN: Other Belinda - Plan: EKG 12-Lead, Comprehensive metabolic panel, Hemoglobin A1c, Insulin, random  Shortness of breath on exertion  Chronic low back pain, unspecified back pain laterality, unspecified whether sciatica present  B12 nutritional deficiency - Plan: Vitamin B12  Vitamin D deficiency - Plan: VITAMIN D 25 Hydroxy (Vit-D Deficiency, Fractures)  Depression screening  At risk for  osteoporosis  Class 3 severe obesity with serious comorbidity and body mass index (BMI) of 40.0 to 44.9 in  adult, unspecified obesity type [facility])  PLAN: Belinda Belinda Williams was informed that her Belinda may be related to obesity, depression or many other causes. Labs will be ordered, and in the meanwhile Belinda Williams has agreed to work on diet, exercise and weight loss to help with Belinda. Proper sleep hygiene was discussed including the need for 7-8 hours of quality sleep each night. A sleep study was not ordered based on symptoms and Epworth score.  Dyspnea on exertion Belinda Williams's shortness of breath appears to be obesity related and exercise induced. She has agreed to work on weight loss and gradually increase exercise (cardio and resistance) to treat her exercise induced shortness of breath. If Michaele follows our instructions and loses weight without improvement of her shortness of breath, we will plan to refer to pulmonology. We will monitor this condition regularly. Belinda Williams agrees to this plan.  Vitamin D Deficiency Yittel was informed that low vitamin D levels contributes to Belinda and are associated with obesity, breast, and colon cancer. We will check vitamin D level and she will follow up for routine testing of vitamin D, at least 2-3 times per year. Issabelle agrees to follow up with our clinic in 2 weeks.  At risk for osteopenia and osteoporosis Belinda Williams was given extended  (15 minutes) osteoporosis prevention counseling today. Adaliah is at risk for osteopenia and osteoporosis due to her vitamin D deficiency. She was encouraged to take her vitamin D and follow her higher calcium diet and increase strengthening exercise to help strengthen her bones and decrease her risk of osteopenia and osteoporosis.  Lower Back Pain Korra will consider lower back strengthening and gentle exercises in the future. Shawnell will follow up with our clinic in 2 weeks.  Vitamin B12 deficiency Sultana  will work on increasing B12 rich foods in her diet. B12 supplementation was not prescribed today. We will check vitamin B12 level today and Anaid will follow up as directed.  Depression Screen Jemmie had a mildly positive depression screening. Depression is commonly associated with obesity and often results in emotional eating behaviors. We will monitor this closely and work on CBT to help improve the non-hunger eating patterns. Referral to Psychology may be required if no improvement is seen as she continues in our clinic.  Obesity Eugenie is currently in the action stage of change and her goal is to continue with weight loss efforts. I recommend Norina begin the structured treatment plan as follows:  She has agreed to follow the Category 3 plan Marializ has been instructed to eventually work up to a goal of 150 minutes of combined cardio and strengthening exercise per week for weight loss and overall health benefits. We discussed the following Behavioral Modification Strategies today: increasing lean protein intake, decreasing simple carbohydrates , increasing vegetables, decrease eating out and work on meal planning and easy cooking plans   She was informed of the importance of frequent follow up visits to maximize her success with intensive lifestyle modifications for her multiple health conditions. She was informed we would discuss her lab results at her next visit unless there is a critical issue that needs to be addressed sooner. Alayah agreed to keep her next visit at the agreed upon time to discuss these results.    OBESITY BEHAVIORAL INTERVENTION VISIT  Today's visit was # 1   Starting weight: 245 lbs Starting date: 12/22/17 Today's weight : 245 lbs  Today's date: 12/22/2017 Total lbs lost to date: 0  ASK: We discussed the diagnosis of obesity with Jeris  Eyster today and Onna agreed to give [place] permission to discuss obesity behavioral modification therapy  today.  ASSESS: Tonni has the diagnosis of obesity and her BMI today is 43.41 Aalaysia is in the action stage of change   ADVISE: Taylyn was educated on the multiple health risks of obesity as well as the benefit of weight loss to improve her health. She was advised of the need for long term treatment and the importance of lifestyle modifications to improve her current health and to decrease her risk of future health problems.  AGREE: Multiple dietary modification options and treatment options were discussed and  Earnest agreed to follow the recommendations documented in the above note.  ARRANGE: Shian was educated on the importance of frequent visits to treat obesity as outlined per CMS and USPSTF guidelines and agreed to schedule her next follow up appointment today.  Corey Skains, am acting as Location manager for [employer]. Owens Shark, DO  I have reviewed the above documentation for accuracy and completeness, and I agree with the above. -Jearld Lesch, DO

## 2017-12-30 ENCOUNTER — Encounter (INDEPENDENT_AMBULATORY_CARE_PROVIDER_SITE_OTHER): Payer: Self-pay | Admitting: Bariatrics

## 2018-01-05 ENCOUNTER — Ambulatory Visit (INDEPENDENT_AMBULATORY_CARE_PROVIDER_SITE_OTHER): Payer: BC Managed Care – PPO | Admitting: Bariatrics

## 2018-01-05 ENCOUNTER — Encounter (INDEPENDENT_AMBULATORY_CARE_PROVIDER_SITE_OTHER): Payer: Self-pay | Admitting: Bariatrics

## 2018-01-05 VITALS — BP 129/84 | HR 92 | Temp 98.1°F | Ht 63.0 in | Wt 248.0 lb

## 2018-01-05 DIAGNOSIS — E559 Vitamin D deficiency, unspecified: Secondary | ICD-10-CM

## 2018-01-05 DIAGNOSIS — E8881 Metabolic syndrome: Secondary | ICD-10-CM | POA: Diagnosis not present

## 2018-01-05 DIAGNOSIS — Z9189 Other specified personal risk factors, not elsewhere classified: Secondary | ICD-10-CM

## 2018-01-05 DIAGNOSIS — Z6841 Body Mass Index (BMI) 40.0 and over, adult: Secondary | ICD-10-CM

## 2018-01-05 MED ORDER — VITAMIN D (ERGOCALCIFEROL) 1.25 MG (50000 UNIT) PO CAPS: 50000.0000 [IU] | ORAL_CAPSULE | ORAL | 0 refills | Status: DC

## 2018-01-09 NOTE — Progress Notes (Signed)
Office: [phone]  /  Fax: [phone]   HPI:   Chief Complaint: OBESITY Belinda Williams is here to discuss her progress with her obesity treatment plan. She is on the  follow the Category 3 plan and is following her eating plan approximately 0 % of the time. She states she is exercising 0 minutes 0 times per week. Belinda Williams is currently struggling with being sick (allergies/cold). She has "eaten only soup" and drank juice and ginger ale. Has started since Monday. States it's "a lot of meat".  Her weight is 248 lb (112.5 kg) today and has not lost weight since her last visit. She has lost 0 lbs since starting treatment with [place].  Vitamin D deficiency Belinda Williams has a diagnosis of vitamin D deficiency. She is currently taking vit D OTC and denies nausea, vomiting or muscle weakness.  Ref. Range 12/22/2017 08:57  Vitamin D, 25-Hydroxy Latest Ref Range: 30.0 - 100.0 ng/mL 26.0 (L)   Insulin Resistance Belinda Williams has a diagnosis of insulin resistance based on her elevated fasting insulin level of 17.0 and HgbA1c of 5.4.  Although Belinda Williams's blood glucose readings are still under good control, insulin resistance puts her at greater risk of metabolic syndrome and diabetes. She is not taking metformin currently and continues to work on diet and exercise to decrease risk of diabetes.  At risk for diabetes Belinda Williams is at higher than averagerisk for developing diabetes due to her obesity. She currently denies polyuria or polydipsia.  ALLERGIES: Allergies  Allergen Reactions  . Sulfonamide Derivatives Nausea Only  . Ciprofloxacin Nausea Only and Rash  . Penicillins Nausea Only and Rash    Tolerates Amoxicillin OK    MEDICATIONS: Current Outpatient Medications on File Prior to Visit  Medication Sig Dispense Refill  . Aspirin-Acetaminophen (GOODY BODY PAIN) 500-325 MG PACK Take by mouth as needed.      . cholecalciferol (VITAMIN D) 1000 units tablet Take 1,000 Units by mouth daily.    . rizatriptan  (MAXALT) 10 MG tablet Take 1 tablet (10 mg total) by mouth as needed for migraine. May repeat in 2 hours if needed 10 tablet 11  . vitamin B-12 (CYANOCOBALAMIN) 1000 MCG tablet Take 1 tablet (1,000 mcg total) by mouth daily.     No current facility-administered medications on file prior to visit.     PAST MEDICAL HISTORY: Past Medical History:  Diagnosis Date  . ALLERGIC RHINITIS   . Anemia   . Asthma    as a child - no problem as adult  . Headache(784.0)   . Heart murmur    as a child - no problem as adult - closed per pt  . Lactose intolerance   . Lower back pain   . MIGRAINE, MENSTRUAL   . Morbid obesity ([place])   . PAP SMEAR, ABNORMAL 08/23/2006  . Thyroglossal duct cyst    Excision June 2013 by ENT (bates)  . Vitamin B 12 deficiency   . Vitamin D deficiency   . Weight gain     PAST SURGICAL HISTORY: Past Surgical History:  Procedure Laterality Date  . DERMOID CYST  EXCISION  08/2011   Neck  . DILATATION & CURRETTAGE/HYSTEROSCOPY WITH RESECTOCOPE  02/2012  . thyroductal cyst    . vaginal cyst removed      SOCIAL HISTORY: Social History   Tobacco Use  . Smoking status: Never Smoker  . Smokeless tobacco: Never Used  Substance Use Topics  . Alcohol use: Yes    Comment: socially  . Drug  use: No    FAMILY HISTORY: Family History  Problem Relation Age of Onset  . Thyroid disease Sister   . Breast cancer Maternal Aunt   . Cancer Maternal Aunt        breast  . Diabetes Maternal Grandmother   . Hypertension Maternal Grandmother   . Stroke Maternal Grandmother   . Hypertension Other   . Hypertension Mother   . Sleep apnea Father   . Obesity Father     ROS: Review of Systems  Constitutional: Negative for weight loss.  Gastrointestinal: Negative for nausea and vomiting.  Musculoskeletal:       Negative for muscle weakness  Endo/Heme/Allergies: Negative for polydipsia.       Negative for polyuria    PHYSICAL EXAM: Blood pressure 129/84, pulse 92,  temperature 98.1 F (36.7 C), temperature source Oral, height 5\' 3"  (1.6 m), weight 248 lb (112.5 kg), SpO2 100 %. Body mass index is 43.93 kg/m. Physical Exam  Constitutional: She is oriented to person, place, and time. She appears well-developed and well-nourished.  HENT:  Head: Normocephalic.  Neck: Normal range of motion.  Cardiovascular: Normal rate.  Pulmonary/Chest: Effort normal.  Musculoskeletal: Normal range of motion.  Neurological: She is alert and oriented to person, place, and time.  Skin: Skin is warm and dry.  Psychiatric: She has a normal mood and affect. Her behavior is normal.  Vitals reviewed.   RECENT LABS AND TESTS: BMET    Component Value Date/Time   NA 139 12/22/2017 0857   K 4.7 12/22/2017 0857   CL 101 12/22/2017 0857   CO2 22 12/22/2017 0857   GLUCOSE 92 12/22/2017 0857   GLUCOSE 94 09/09/2017 0930   GLUCOSE 82 01/25/2006 1212   BUN 10 12/22/2017 0857   CREATININE 0.89 12/22/2017 0857   CALCIUM 9.5 12/22/2017 0857   GFRNONAA 81 12/22/2017 0857   GFRAA 93 12/22/2017 0857   Lab Results  Component Value Date   HGBA1C 5.4 12/22/2017   HGBA1C 5.5 09/09/2017   Lab Results  Component Value Date   INSULIN 17.0 12/22/2017   CBC    Component Value Date/Time   WBC 5.8 09/09/2017 0930   RBC 4.69 09/09/2017 0930   HGB 10.7 (L) 09/09/2017 0930   HCT 33.3 (L) 09/09/2017 0930   PLT 322.0 09/09/2017 0930   MCV 71.0 (L) 09/09/2017 0930   MCH 20.4 (L) 02/28/2012 0814   MCHC 32.1 09/09/2017 0930   RDW 23.6 (H) 09/09/2017 0930   LYMPHSABS 1.9 09/09/2017 0930   MONOABS 0.3 09/09/2017 0930   EOSABS 0.4 09/09/2017 0930   BASOSABS 0.1 09/09/2017 0930   Iron/TIBC/Ferritin/ %Sat    Component Value Date/Time   IRON 18 (L) 09/09/2017 0930   TIBC 343 09/09/2017 0930   FERRITIN 13 (L) 09/09/2017 0930   IRONPCTSAT 5 (L) 09/09/2017 0930   Lipid Panel     Component Value Date/Time   CHOL 153 09/09/2017 0930   TRIG 54.0 09/09/2017 0930   TRIG 44  01/25/2006 1212   HDL 41.80 09/09/2017 0930   CHOLHDL 4 09/09/2017 0930   VLDL 10.8 09/09/2017 0930   LDLCALC 101 (H) 09/09/2017 0930   Hepatic Function Panel     Component Value Date/Time   PROT 7.0 12/22/2017 0857   ALBUMIN 3.9 12/22/2017 0857   AST 9 12/22/2017 0857   ALT 10 12/22/2017 0857   ALKPHOS 96 12/22/2017 0857   BILITOT 0.4 12/22/2017 0857   BILIDIR 0.0 08/14/2012 0929  Component Value Date/Time   TSH 2.81 09/09/2017 0930   TSH 1.71 07/20/2017 0845   TSH 2.55 03/16/2016 1605    Ref. Range 12/22/2017 08:57  Vitamin D, 25-Hydroxy Latest Ref Range: 30.0 - 100.0 ng/mL 26.0 (L)    ASSESSMENT AND PLAN: Vitamin D deficiency - Plan: Vitamin D, Ergocalciferol, (DRISDOL) 50000 units CAPS capsule  Insulin resistance  At risk for diabetes mellitus  Class 3 severe obesity with serious comorbidity and body mass index (BMI) of 40.0 to 44.9 in adult, unspecified obesity type ([place])  PLAN: Vitamin D Deficiency Belinda Williams was informed that low vitamin D levels contributes to fatigue and are associated with obesity, breast, and colon cancer. She agrees to start to take prescription Vit D @50 ,000 IU every week #4 with no refills and will follow up for routine testing of vitamin D, at least 2-3 times per year. She was informed of the risk of over-replacement of vitamin D and agrees to not increase her dose unless she discusses this with [place] first. Agrees to follow up with our clinic as directed.   Insulin Resistance Belinda Williams will continue to work on weight loss, exercise, and decreasing simple carbohydrates in her diet to help decrease the risk of diabetes. We dicussed metformin including benefits and risks. She was informed that eating too many simple carbohydrates or too many calories at one sitting increases the likelihood of GI side effects. Belinda Williams declined metformin for now and prescription was not written today. Belinda Williams agreed to follow up with [place] as directed to monitor her  progress. She agrees to decrease carbohydrates and increase protein intake.   Cardiovascular risk counseling Belinda Williams was given extended (15 minutes) coronary artery disease prevention counseling today. She is 42 y.o. female and has risk factors for heart disease including obesity. We discussed intensive lifestyle modifications today with an emphasis on specific weight loss instructions and strategies. Pt was also informed of the importance of increasing exercise and decreasing saturated fats to help prevent heart disease.  Obesity Belinda Williams is currently in the action stage of change. As such, her goal is to continue with weight loss efforts She has agreed to resume the plan and  follow the Category 3 plan with more meal planning.  Belinda Williams has been instructed to work up to a goal of 150 minutes of combined cardio and strengthening exercise per week for weight loss and overall health benefits. We discussed the following Behavioral Modification Strategies today: increasing lean protein intake, decreasing simple carbohydrates , increasing vegetables, decreasing sodium intake, work on meal planning and easy cooking plans, decrease ETOH, increasing water intake, no skipping meals, and decrease liquid calories.    Belinda Williams has agreed to follow up with our clinic in 2 weeks. She was informed of the importance of frequent follow up visits to maximize her success with intensive lifestyle modifications for her multiple health conditions.   OBESITY BEHAVIORAL INTERVENTION VISIT  Today's visit was # 2   Starting weight: 245 lb Starting date: 12/22/17 Today's weight : 248 lb Today's date: 01/05/18 Total lbs lost to date: 0    ASK: We discussed the diagnosis of obesity with Belinda Williams today and Belinda Williams agreed to give [place] permission to discuss obesity behavioral modification therapy today.  ASSESS: Belinda Williams has the diagnosis of obesity and her BMI today is 43.94.  Belinda Williams is in the action  stage of change   ADVISE: January was educated on the multiple health risks of obesity as well as the benefit of weight loss to improve  her health. She was advised of the need for long term treatment and the importance of lifestyle modifications to improve her current health and to decrease her risk of future health problems.  AGREE: Multiple dietary modification options and treatment options were discussed and  Belinda Williams agreed to follow the recommendations documented in the above note.  ARRANGE: Belinda Williams was educated on the importance of frequent visits to treat obesity as outlined per CMS and USPSTF guidelines and agreed to schedule her next follow up appointment today.  Leary Roca, am acting as transcriptionist for [employer], DO   I have reviewed the above documentation for accuracy and completeness, and I agree with the above. -Jearld Lesch, DO

## 2018-01-10 DIAGNOSIS — Z6841 Body Mass Index (BMI) 40.0 and over, adult: Secondary | ICD-10-CM

## 2018-01-10 DIAGNOSIS — E8881 Metabolic syndrome: Secondary | ICD-10-CM | POA: Insufficient documentation

## 2018-01-13 ENCOUNTER — Encounter (INDEPENDENT_AMBULATORY_CARE_PROVIDER_SITE_OTHER): Payer: Self-pay | Admitting: Bariatrics

## 2018-01-23 ENCOUNTER — Encounter (INDEPENDENT_AMBULATORY_CARE_PROVIDER_SITE_OTHER): Payer: Self-pay

## 2018-01-23 ENCOUNTER — Ambulatory Visit (INDEPENDENT_AMBULATORY_CARE_PROVIDER_SITE_OTHER): Payer: BC Managed Care – PPO | Admitting: Bariatrics

## 2018-01-24 ENCOUNTER — Ambulatory Visit (INDEPENDENT_AMBULATORY_CARE_PROVIDER_SITE_OTHER): Payer: BC Managed Care – PPO | Admitting: Physician Assistant

## 2018-01-24 ENCOUNTER — Encounter (INDEPENDENT_AMBULATORY_CARE_PROVIDER_SITE_OTHER): Payer: Self-pay | Admitting: Physician Assistant

## 2018-01-24 VITALS — BP 121/80 | HR 94 | Temp 98.3°F | Ht 63.0 in | Wt 248.0 lb

## 2018-01-24 DIAGNOSIS — Z6841 Body Mass Index (BMI) 40.0 and over, adult: Secondary | ICD-10-CM

## 2018-01-24 DIAGNOSIS — E559 Vitamin D deficiency, unspecified: Secondary | ICD-10-CM

## 2018-01-24 NOTE — Progress Notes (Signed)
Office: [phone]  /  Fax: [phone]   HPI:   Chief Complaint: OBESITY Belinda Williams is here to discuss her progress with her obesity treatment plan. She is on the Category 3 plan and is following her eating plan approximately 70 % of the time. She states she is exercising 0 minutes 0 times per week. Belinda Williams reports that she was at a work conference for a week and unable to eat on the plan. She is struggling to get all of her protein in, especially at dinner.  Her weight is 248 lb (112.5 kg) today and has not lost weight since her last visit. She has lost 0 lbs since starting treatment with [place].  Vitamin D Deficiency Belinda Williams has a diagnosis of vitamin D deficiency. She is on prescription Vit D and denies nausea, vomiting or muscle weakness.  ALLERGIES: Allergies  Allergen Reactions  . Sulfonamide Derivatives Nausea Only  . Ciprofloxacin Nausea Only and Rash  . Penicillins Nausea Only and Rash    Tolerates Amoxicillin OK    MEDICATIONS: Current Outpatient Medications on File Prior to Visit  Medication Sig Dispense Refill  . Aspirin-Acetaminophen (GOODY BODY PAIN) 500-325 MG PACK Take by mouth as needed.      . cholecalciferol (VITAMIN D) 1000 units tablet Take 1,000 Units by mouth daily.    . rizatriptan (MAXALT) 10 MG tablet Take 1 tablet (10 mg total) by mouth as needed for migraine. May repeat in 2 hours if needed 10 tablet 11  . vitamin B-12 (CYANOCOBALAMIN) 1000 MCG tablet Take 1 tablet (1,000 mcg total) by mouth daily.    . Vitamin D, Ergocalciferol, (DRISDOL) 50000 units CAPS capsule Take 1 capsule (50,000 Units total) by mouth every 7 (seven) days. 4 capsule 0   No current facility-administered medications on file prior to visit.     PAST MEDICAL HISTORY: Past Medical History:  Diagnosis Date  . ALLERGIC RHINITIS   . Anemia   . Asthma    as a child - no problem as adult  . Headache(784.0)   . Heart murmur    as a child - no problem as adult - closed per pt  .  Lactose intolerance   . Lower back pain   . MIGRAINE, MENSTRUAL   . Morbid obesity ([place])   . PAP SMEAR, ABNORMAL 08/23/2006  . Thyroglossal duct cyst    Excision June 2013 by ENT ([facility])  . Vitamin B 12 deficiency   . Vitamin D deficiency   . Weight gain     PAST SURGICAL HISTORY: Past Surgical History:  Procedure Laterality Date  . DERMOID CYST  EXCISION  08/2011   Neck  . DILATATION & CURRETTAGE/HYSTEROSCOPY WITH RESECTOCOPE  02/2012  . thyroductal cyst    . vaginal cyst removed      SOCIAL HISTORY: Social History   Tobacco Use  . Smoking status: Never Smoker  . Smokeless tobacco: Never Used  Substance Use Topics  . Alcohol use: Yes    Comment: socially  . Drug use: No    FAMILY HISTORY: Family History  Problem Relation Age of Onset  . Thyroid disease Sister   . Breast cancer Maternal Aunt   . Cancer Maternal Aunt        breast  . Diabetes Maternal Grandmother   . Hypertension Maternal Grandmother   . Stroke Maternal Grandmother   . Hypertension Other   . Hypertension Mother   . Sleep apnea Father   . Obesity Father     ROS:  Review of Systems  Constitutional: Negative for weight loss.  Gastrointestinal: Negative for nausea and vomiting.  Musculoskeletal:       Negative muscle weakness    PHYSICAL EXAM: Blood pressure 121/80, pulse 94, temperature 98.3 F (36.8 C), temperature source Oral, height 5\' 3"  (1.6 m), weight 248 lb (112.5 kg), SpO2 99 %. Body mass index is 43.93 kg/m. Physical Exam  Constitutional: She is oriented to person, place, and time. She appears well-developed and well-nourished.  Cardiovascular: Normal rate.  Pulmonary/Chest: Effort normal.  Musculoskeletal: Normal range of motion.  Neurological: She is oriented to person, place, and time.  Skin: Skin is warm and dry.  Psychiatric: She has a normal mood and affect. Her behavior is normal.  Vitals reviewed.   RECENT LABS AND TESTS: BMET    Component Value Date/Time   NA  139 12/22/2017 0857   K 4.7 12/22/2017 0857   CL 101 12/22/2017 0857   CO2 22 12/22/2017 0857   GLUCOSE 92 12/22/2017 0857   GLUCOSE 94 09/09/2017 0930   GLUCOSE 82 01/25/2006 1212   BUN 10 12/22/2017 0857   CREATININE 0.89 12/22/2017 0857   CALCIUM 9.5 12/22/2017 0857   GFRNONAA 81 12/22/2017 0857   GFRAA 93 12/22/2017 0857   Lab Results  Component Value Date   HGBA1C 5.4 12/22/2017   HGBA1C 5.5 09/09/2017   Lab Results  Component Value Date   INSULIN 17.0 12/22/2017   CBC    Component Value Date/Time   WBC 5.8 09/09/2017 0930   RBC 4.69 09/09/2017 0930   HGB 10.7 (L) 09/09/2017 0930   HCT 33.3 (L) 09/09/2017 0930   PLT 322.0 09/09/2017 0930   MCV 71.0 (L) 09/09/2017 0930   MCH 20.4 (L) 02/28/2012 0814   MCHC 32.1 09/09/2017 0930   RDW 23.6 (H) 09/09/2017 0930   LYMPHSABS 1.9 09/09/2017 0930   MONOABS 0.3 09/09/2017 0930   EOSABS 0.4 09/09/2017 0930   BASOSABS 0.1 09/09/2017 0930   Iron/TIBC/Ferritin/ %Sat    Component Value Date/Time   IRON 18 (L) 09/09/2017 0930   TIBC 343 09/09/2017 0930   FERRITIN 13 (L) 09/09/2017 0930   IRONPCTSAT 5 (L) 09/09/2017 0930   Lipid Panel     Component Value Date/Time   CHOL 153 09/09/2017 0930   TRIG 54.0 09/09/2017 0930   TRIG 44 01/25/2006 1212   HDL 41.80 09/09/2017 0930   CHOLHDL 4 09/09/2017 0930   VLDL 10.8 09/09/2017 0930   LDLCALC 101 (H) 09/09/2017 0930   Hepatic Function Panel     Component Value Date/Time   PROT 7.0 12/22/2017 0857   ALBUMIN 3.9 12/22/2017 0857   AST 9 12/22/2017 0857   ALT 10 12/22/2017 0857   ALKPHOS 96 12/22/2017 0857   BILITOT 0.4 12/22/2017 0857   BILIDIR 0.0 08/14/2012 0929      Component Value Date/Time   TSH 2.81 09/09/2017 0930   TSH 1.71 07/20/2017 0845   TSH 2.55 03/16/2016 1605  Results for HASINI, PEACHEY (MRN [id number]) as of 01/24/2018 14:47  Ref. Range 12/22/2017 08:57  Vitamin D, 25-Hydroxy Latest Ref Range: 30.0 - 100.0 ng/mL 26.0 (L)    ASSESSMENT AND  PLAN: Vitamin D deficiency  Class 3 severe obesity with serious comorbidity and body mass index (BMI) of 40.0 to 44.9 in adult, unspecified obesity type ([facility])  PLAN:  Vitamin D Deficiency Belinda Williams was informed that low vitamin D levels contributes to fatigue and are associated with obesity, breast, and colon cancer. Aundraya agrees to continue  taking prescription Vit D @50 ,000 IU every week and will follow up for routine testing of vitamin D, at least 2-3 times per year. She was informed of the risk of over-replacement of vitamin D and agrees to not increase her dose unless she discusses this with [place] first. Belinda Williams agrees to follow up with our clinic in 2 weeks.  I spent > than 50% of the 15 minute visit on counseling as documented in the note.  Obesity Belinda Williams is currently in the action stage of change. As such, her goal is to continue with weight loss efforts She has agreed to follow the Category 3 plan Belinda Williams has been instructed to work up to a goal of 150 minutes of combined cardio and strengthening exercise per week for weight loss and overall health benefits. We discussed the following Behavioral Modification Strategies today: increasing lean protein intake and work on meal planning and easy cooking plans   Belinda Williams has agreed to follow up with our clinic in 2 weeks. She was informed of the importance of frequent follow up visits to maximize her success with intensive lifestyle modifications for her multiple health conditions.   OBESITY BEHAVIORAL INTERVENTION VISIT  Today's visit was # 3   Starting weight: 245 lbs Starting date: 12/22/17 Today's weight : 248 lbs Today's date: 01/24/2018 Total lbs lost to date: 0    ASK: We discussed the diagnosis of obesity with Belinda Williams today and [place] agreed to give [place] permission to discuss obesity behavioral modification therapy today.  ASSESS: Belinda Williams has the diagnosis of obesity and her BMI today is 43.94 Belinda Williams is  in the action stage of change   ADVISE: Belinda Williams was educated on the multiple health risks of obesity as well as the benefit of weight loss to improve her health. She was advised of the need for long term treatment and the importance of lifestyle modifications.  AGREE: Multiple dietary modification options and treatment options were discussed and  Belinda Williams agreed to the above obesity treatment plan.  Belinda Williams, am acting as transcriptionist for Abby Potash, PA-C I, Abby Potash, PA-C have reviewed above note and agree with its content

## 2018-02-01 ENCOUNTER — Encounter (HOSPITAL_COMMUNITY): Payer: Self-pay

## 2018-02-01 ENCOUNTER — Emergency Department (HOSPITAL_COMMUNITY)
Admission: EM | Admit: 2018-02-01 | Discharge: 2018-02-01 | Disposition: A | Discharge status: Home / Self Care | Payer: BC Managed Care – PPO | Attending: Emergency Medicine | Admitting: Emergency Medicine

## 2018-02-01 ENCOUNTER — Emergency Department (HOSPITAL_COMMUNITY): Payer: BC Managed Care – PPO

## 2018-02-01 DIAGNOSIS — Y92410 Unspecified street and highway as the place of occurrence of the external cause: Secondary | ICD-10-CM | POA: Diagnosis not present

## 2018-02-01 DIAGNOSIS — Z79899 Other long term (current) drug therapy: Secondary | ICD-10-CM | POA: Diagnosis not present

## 2018-02-01 DIAGNOSIS — Y939 Activity, unspecified: Secondary | ICD-10-CM | POA: Insufficient documentation

## 2018-02-01 DIAGNOSIS — S0093XA Contusion of unspecified part of head, initial encounter: Secondary | ICD-10-CM | POA: Diagnosis not present

## 2018-02-01 DIAGNOSIS — Y999 Unspecified external cause status: Secondary | ICD-10-CM | POA: Insufficient documentation

## 2018-02-01 DIAGNOSIS — S0990XA Unspecified injury of head, initial encounter: Secondary | ICD-10-CM | POA: Diagnosis present

## 2018-02-01 NOTE — ED Notes (Signed)
Patient verbalizes understanding of medications and discharge instructions. No further questions at this time. VSS and patient ambulatory at discharge.   
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
## 2018-02-07 ENCOUNTER — Ambulatory Visit (INDEPENDENT_AMBULATORY_CARE_PROVIDER_SITE_OTHER): Payer: BC Managed Care – PPO | Admitting: Physician Assistant

## 2018-02-07 ENCOUNTER — Encounter (INDEPENDENT_AMBULATORY_CARE_PROVIDER_SITE_OTHER): Payer: Self-pay
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
## 2018-03-20 ENCOUNTER — Ambulatory Visit (INDEPENDENT_AMBULATORY_CARE_PROVIDER_SITE_OTHER)
Admission: RE | Admit: 2018-03-20 | Discharge: 2018-03-20 | Disposition: A | Discharge status: Home / Self Care | Payer: BC Managed Care – PPO | Source: Ambulatory Visit | Attending: Internal Medicine | Admitting: Internal Medicine

## 2018-03-20 ENCOUNTER — Ambulatory Visit: Payer: BC Managed Care – PPO | Admitting: Internal Medicine

## 2018-03-20 ENCOUNTER — Encounter: Payer: Self-pay | Admitting: Internal Medicine

## 2018-03-20 VITALS — BP 132/90 | HR 76 | Temp 98.6°F | Resp 16 | Ht 63.0 in | Wt 250.0 lb

## 2018-03-20 DIAGNOSIS — M25512 Pain in left shoulder: Secondary | ICD-10-CM | POA: Diagnosis not present
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
## 2018-03-20 NOTE — Patient Instructions (Addendum)
Have an x-ray of your shoulder today.    Make an appointment with Dr Tamala Julian our sports medicine doctor for further evaluation.

## 2018-03-21 ENCOUNTER — Encounter: Payer: Self-pay | Admitting: Internal Medicine

## 2018-04-04 ENCOUNTER — Ambulatory Visit: Payer: BC Managed Care – PPO | Admitting: Family Medicine
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
## 2018-04-13 ENCOUNTER — Ambulatory Visit: Payer: BC Managed Care – PPO | Admitting: Family Medicine

## 2018-04-13 ENCOUNTER — Ambulatory Visit: Payer: Self-pay

## 2018-04-13 ENCOUNTER — Encounter: Payer: Self-pay | Admitting: Family Medicine

## 2018-04-13 VITALS — BP 102/72 | HR 87 | Ht 63.0 in | Wt 249.0 lb

## 2018-04-13 DIAGNOSIS — M7552 Bursitis of left shoulder: Secondary | ICD-10-CM | POA: Diagnosis not present

## 2018-04-13 DIAGNOSIS — G8929 Other chronic pain: Secondary | ICD-10-CM | POA: Diagnosis not present

## 2018-04-13 DIAGNOSIS — M25512 Pain in left shoulder: Secondary | ICD-10-CM | POA: Diagnosis not present

## 2018-04-13 MED ORDER — DICLOFENAC SODIUM 2 % TD SOLN: 2.0000 g | Freq: Two times a day (BID) | TRANSDERMAL | 3 refills | Status: DC
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
## 2018-04-13 NOTE — Patient Instructions (Addendum)
Good to see you  Bursitis it appears Ice 20 minutes 2 times daily. Usually after activity and before bed. pennsaid pinkie amount topically 2 times daily as needed.  Keep hands within peripheral vision  Exercises 3 times a week.  You should do well  See me again in 4-5 weeks

## 2018-04-20 ENCOUNTER — Telehealth: Payer: Self-pay | Admitting: *Deleted

## 2018-04-20 MED ORDER — DICLOFENAC SODIUM 1 % TD GEL: 2.0000 g | Freq: Four times a day (QID) | TRANSDERMAL | 1 refills | Status: DC

## 2018-04-20 NOTE — Telephone Encounter (Signed)
Insurance does not cover Pennsaid.  Per Dr. Tamala Julian okay to send in voltaren gel 1%

## 2018-05-11 ENCOUNTER — Ambulatory Visit: Payer: Self-pay

## 2018-05-11 ENCOUNTER — Ambulatory Visit: Payer: BC Managed Care – PPO | Admitting: Family Medicine

## 2018-05-11 VITALS — BP 110/82 | HR 79 | Ht 63.0 in | Wt 248.0 lb

## 2018-05-11 DIAGNOSIS — G8929 Other chronic pain: Secondary | ICD-10-CM

## 2018-05-11 DIAGNOSIS — M7552 Bursitis of left shoulder: Secondary | ICD-10-CM | POA: Diagnosis not present

## 2018-05-11 DIAGNOSIS — M25512 Pain in left shoulder: Secondary | ICD-10-CM

## 2018-05-11 NOTE — Assessment & Plan Note (Signed)
Patient given an injection today.  Tolerated procedure well.  Continue all other conservative therapy.  Patient declined any formal physical therapy.  Do not feel that manipulation would be beneficial.  Discussed icing regimen and home exercise.  Follow-up again in 4 to 6 weeks
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
## 2018-05-11 NOTE — Patient Instructions (Signed)
Good to see you  Injected the shoulder today  Ice 20 minutes 2 times daily. Usually after activity and before bed. Keep doing the exercises 3 times a week  pennsaid pinkie amount topically 2 times daily as needed.  See me again in 6 weeks to make sure you are perfect

## 2018-12-11 ENCOUNTER — Other Ambulatory Visit: Payer: Self-pay

## 2018-12-11 DIAGNOSIS — Z20822 Contact with and (suspected) exposure to covid-19: Secondary | ICD-10-CM

## 2018-12-12 LAB — NOVEL CORONAVIRUS, NAA: SARS-CoV-2, NAA: NOT DETECTED

## 2019-05-17 ENCOUNTER — Ambulatory Visit: Payer: BC Managed Care – PPO | Attending: Family

## 2019-05-17 DIAGNOSIS — Z23 Encounter for immunization: Secondary | ICD-10-CM | POA: Insufficient documentation

## 2019-05-17 NOTE — Progress Notes (Signed)
   Covid-19 Vaccination Clinic  Name:  Belinda Williams    MRN: [id number] DOB: 01-02-1976  05/17/2019  [name] was observed post Covid-19 immunization for 15 minutes without incident. She was provided with Vaccine Information Sheet and instruction to access the V-Safe system.   [name] was instructed to call 911 with any severe reactions post vaccine: Marland Kitchen Difficulty breathing  . Swelling of face and throat  . A fast heartbeat  . A bad rash all over body  . Dizziness and weakness   Immunizations Administered    Name Date Dose VIS Date Route   Moderna COVID-19 Vaccine 05/17/2019  4:18 PM 0.5 mL 02/13/2019 Intramuscular   Manufacturer: Moderna   Lot: [id number]   [id number]
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
## 2019-06-19 ENCOUNTER — Ambulatory Visit: Payer: BC Managed Care – PPO | Attending: Internal Medicine

## 2019-06-19 DIAGNOSIS — Z23 Encounter for immunization: Secondary | ICD-10-CM

## 2019-06-19 NOTE — Progress Notes (Signed)
   Covid-19 Vaccination Clinic  Name:  Cathy Ravert    MRN: [id number] DOB: 1975/04/10  06/19/2019  Ms. Feehan was observed post Covid-19 immunization for 15 minutes without incident. She was provided with Vaccine Information Sheet and instruction to access the V-Safe system.   Ms. Bertolini was instructed to call 911 with any severe reactions post vaccine: Marland Kitchen Difficulty breathing  . Swelling of face and throat  . A fast heartbeat  . A bad rash all over body  . Dizziness and weakness   Immunizations Administered    Name Date Dose VIS Date Route   Moderna COVID-19 Vaccine 06/19/2019  9:04 AM 0.5 mL 02/13/2019 Intramuscular   Manufacturer: Moderna   Lot: [id number]   [id number]

## 2019-09-20 NOTE — Patient Instructions (Addendum)
Blood work was ordered.    All other Health Maintenance issues reviewed.   All recommended immunizations and age-appropriate screenings are up-to-date or discussed.  No immunization administered today.   Medications reviewed and updated.  Changes include :   none     Please followup in 1 year    Health Maintenance, Female Adopting a healthy lifestyle and getting preventive care are important in promoting health and wellness. Ask your health care provider about:  The right schedule for you to have regular tests and exams.  Things you can do on your own to prevent diseases and keep yourself healthy. What should I know about diet, weight, and exercise? Eat a healthy diet   Eat a diet that includes plenty of vegetables, fruits, low-fat dairy products, and lean protein.  Do not eat a lot of foods that are high in solid fats, added sugars, or sodium. Maintain a healthy weight Body mass index (BMI) is used to identify weight problems. It estimates body fat based on height and weight. Your health care provider can help determine your BMI and help you achieve or maintain a healthy weight. Get regular exercise Get regular exercise. This is one of the most important things you can do for your health. Most adults should:  Exercise for at least 150 minutes each week. The exercise should increase your heart rate and make you sweat (moderate-intensity exercise).  Do strengthening exercises at least twice a week. This is in addition to the moderate-intensity exercise.  Spend less time sitting. Even light physical activity can be beneficial. Watch cholesterol and blood lipids Have your blood tested for lipids and cholesterol at 44 years of age, then have this test every 5 years. Have your cholesterol levels checked more often if:  Your lipid or cholesterol levels are high.  You are older than 44 years of age.  You are at high risk for heart disease. What should I know about cancer  screening? Depending on your health history and family history, you may need to have cancer screening at various ages. This may include screening for:  Breast cancer.  Cervical cancer.  Colorectal cancer.  Skin cancer.  Lung cancer. What should I know about heart disease, diabetes, and high blood pressure? Blood pressure and heart disease  High blood pressure causes heart disease and increases the risk of stroke. This is more likely to develop in people who have high blood pressure readings, are of African descent, or are overweight.  Have your blood pressure checked: ? Every 3-5 years if you are 18-39 years of age. ? Every year if you are 40 years old or older. Diabetes Have regular diabetes screenings. This checks your fasting blood sugar level. Have the screening done:  Once every three years after age 40 if you are at a normal weight and have a low risk for diabetes.  More often and at a younger age if you are overweight or have a high risk for diabetes. What should I know about preventing infection? Hepatitis B If you have a higher risk for hepatitis B, you should be screened for this virus. Talk with your health care provider to find out if you are at risk for hepatitis B infection. Hepatitis C Testing is recommended for:  Everyone born from 1945 through 1965.  Anyone with known risk factors for hepatitis C. Sexually transmitted infections (STIs)  Get screened for STIs, including gonorrhea and chlamydia, if: ? You are sexually active and are younger than 44 years   of age. ? You are older than 44 years of age and your health care provider tells you that you are at risk for this type of infection. ? Your sexual activity has changed since you were last screened, and you are at increased risk for chlamydia or gonorrhea. Ask your health care provider if you are at risk.  Ask your health care provider about whether you are at high risk for HIV. Your health care provider may  recommend a prescription medicine to help prevent HIV infection. If you choose to take medicine to prevent HIV, you should first get tested for HIV. You should then be tested every 3 months for as long as you are taking the medicine. Pregnancy  If you are about to stop having your period (premenopausal) and you may become pregnant, seek counseling before you get pregnant.  Take 400 to 800 micrograms (mcg) of folic acid every day if you become pregnant.  Ask for birth control (contraception) if you want to prevent pregnancy. Osteoporosis and menopause Osteoporosis is a disease in which the bones lose minerals and strength with aging. This can result in bone fractures. If you are 65 years old or older, or if you are at risk for osteoporosis and fractures, ask your health care provider if you should:  Be screened for bone loss.  Take a calcium or vitamin D supplement to lower your risk of fractures.  Be given hormone replacement therapy (HRT) to treat symptoms of menopause. Follow these instructions at home: Lifestyle  Do not use any products that contain nicotine or tobacco, such as cigarettes, e-cigarettes, and chewing tobacco. If you need help quitting, ask your health care provider.  Do not use street drugs.  Do not share needles.  Ask your health care provider for help if you need support or information about quitting drugs. Alcohol use  Do not drink alcohol if: ? Your health care provider tells you not to drink. ? You are pregnant, may be pregnant, or are planning to become pregnant.  If you drink alcohol: ? Limit how much you use to 0-1 drink a day. ? Limit intake if you are breastfeeding.  Be aware of how much alcohol is in your drink. In the [place] one drink equals one 12 oz bottle of beer (355 mL), one 5 oz glass of wine (148 mL), or one 1 oz glass of hard liquor (44 mL). General instructions  Schedule regular health, dental, and eye exams.  Stay current with your  vaccines.  Tell your health care provider if: ? You often feel depressed. ? You have ever been abused or do not feel safe at home. Summary  Adopting a healthy lifestyle and getting preventive care are important in promoting health and wellness.  Follow your health care provider's instructions about healthy diet, exercising, and getting tested or screened for diseases.  Follow your health care provider's instructions on monitoring your cholesterol and blood pressure. This information is not intended to replace advice given to you by your health care provider. Make sure you discuss any questions you have with your health care provider. Document Revised: 02/22/2018 Document Reviewed: 02/22/2018 Elsevier Patient Education  2020 [employer]  
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
## 2019-09-21 ENCOUNTER — Encounter: Payer: Self-pay | Admitting: Internal Medicine

## 2019-09-21 ENCOUNTER — Ambulatory Visit (INDEPENDENT_AMBULATORY_CARE_PROVIDER_SITE_OTHER): Payer: BC Managed Care – PPO | Admitting: Internal Medicine

## 2019-09-21 ENCOUNTER — Other Ambulatory Visit: Payer: Self-pay

## 2019-09-21 VITALS — BP 126/74 | HR 89 | Temp 97.9°F | Ht 63.0 in | Wt 248.0 lb

## 2019-09-21 DIAGNOSIS — Z0000 Encounter for general adult medical examination without abnormal findings: Secondary | ICD-10-CM

## 2019-09-21 DIAGNOSIS — D649 Anemia, unspecified: Secondary | ICD-10-CM

## 2019-09-21 DIAGNOSIS — G43809 Other migraine, not intractable, without status migrainosus: Secondary | ICD-10-CM

## 2019-09-21 DIAGNOSIS — E538 Deficiency of other specified B group vitamins: Secondary | ICD-10-CM

## 2019-09-21 DIAGNOSIS — Z1159 Encounter for screening for other viral diseases: Secondary | ICD-10-CM

## 2019-09-21 DIAGNOSIS — Z833 Family history of diabetes mellitus: Secondary | ICD-10-CM

## 2019-09-21 NOTE — Assessment & Plan Note (Signed)
Has not had any recent migraines At this point does not require any medication as needed

## 2019-09-21 NOTE — Assessment & Plan Note (Signed)
Chronic [facility] and would does not want to go back Discussed the importance of maintaining regular exercise-she has worked with a trainer in the past and that has helped and she will look into doing that again Discussed decreased calorie intake, calorie counting, avoiding snacking We will check blood work Stressed good sleep, decreasing stress level

## 2019-09-21 NOTE — Assessment & Plan Note (Signed)
-  Has been related to heavy menses Up-to-date with GYN and now on birth control Check CBC, iron levels Currently taking iron

## 2019-09-21 NOTE — Assessment & Plan Note (Signed)
Chronic ?Check B12 level ?

## 2019-09-21 NOTE — Assessment & Plan Note (Signed)
History of insulin resistance Continue regular exercise and weight loss efforts Low sugar/carb diet Check A1c

## 2019-09-24 LAB — IRON,TIBC AND FERRITIN PANEL
%SAT: 20 % (calc) (ref 16–45)
Ferritin: 12 ng/mL — ABNORMAL LOW (ref 16–232)
Iron: 71 ug/dL (ref 40–190)
TIBC: 358 mcg/dL (calc) (ref 250–450)

## 2019-09-24 LAB — COMPREHENSIVE METABOLIC PANEL
AG Ratio: 1.2 (calc) (ref 1.0–2.5)
ALT: 7 U/L (ref 6–29)
AST: 9 U/L — ABNORMAL LOW (ref 10–30)
Albumin: 3.7 g/dL (ref 3.6–5.1)
Alkaline phosphatase (APISO): 78 U/L (ref 31–125)
BUN: 12 mg/dL (ref 7–25)
CO2: 24 mmol/L (ref 20–32)
Calcium: 9.2 mg/dL (ref 8.6–10.2)
Chloride: 105 mmol/L (ref 98–110)
Creat: 0.86 mg/dL (ref 0.50–1.10)
Globulin: 3 g/dL (calc) (ref 1.9–3.7)
Glucose, Bld: 86 mg/dL (ref 65–99)
Potassium: 4.2 mmol/L (ref 3.5–5.3)
Sodium: 138 mmol/L (ref 135–146)
Total Bilirubin: 0.4 mg/dL (ref 0.2–1.2)
Total Protein: 6.7 g/dL (ref 6.1–8.1)

## 2019-09-24 LAB — CBC WITH DIFFERENTIAL/PLATELET
Absolute Monocytes: 250 cells/uL (ref 200–950)
Basophils Absolute: 20 cells/uL (ref 0–200)
Basophils Relative: 0.4 %
Eosinophils Absolute: 279 cells/uL (ref 15–500)
Eosinophils Relative: 5.7 %
HCT: 32.5 % — ABNORMAL LOW (ref 35.0–45.0)
Hemoglobin: 9.7 g/dL — ABNORMAL LOW (ref 11.7–15.5)
Lymphs Abs: 1906 cells/uL (ref 850–3900)
MCH: 21.3 pg — ABNORMAL LOW (ref 27.0–33.0)
MCHC: 29.8 g/dL — ABNORMAL LOW (ref 32.0–36.0)
MCV: 71.4 fL — ABNORMAL LOW (ref 80.0–100.0)
MPV: 11.1 fL (ref 7.5–12.5)
Monocytes Relative: 5.1 %
Neutro Abs: 2445 cells/uL (ref 1500–7800)
Neutrophils Relative %: 49.9 %
Platelets: 417 10*3/uL — ABNORMAL HIGH (ref 140–400)
RBC: 4.55 10*6/uL (ref 3.80–5.10)
RDW: 20.2 % — ABNORMAL HIGH (ref 11.0–15.0)
Total Lymphocyte: 38.9 %
WBC: 4.9 10*3/uL (ref 3.8–10.8)

## 2019-09-24 LAB — HEMOGLOBIN A1C
Hgb A1c MFr Bld: 5.2 % of total Hgb (ref <5.7)
Mean Plasma Glucose: 103 (calc)
eAG (mmol/L): 5.7 (calc)

## 2019-09-24 LAB — HEPATITIS C ANTIBODY
Hepatitis C Ab: NONREACTIVE
SIGNAL TO CUT-OFF: 0.01 (ref <1.00)

## 2019-09-24 LAB — TSH: TSH: 2.46 mIU/L

## 2019-09-24 LAB — LIPID PANEL
Cholesterol: 166 mg/dL (ref <200)
HDL: 37 mg/dL — ABNORMAL LOW (ref >=50)
LDL Cholesterol (Calc): 113 mg/dL (calc) — ABNORMAL HIGH
Non-HDL Cholesterol (Calc): 129 mg/dL (calc) (ref <130)
Total CHOL/HDL Ratio: 4.5 (calc) (ref <5.0)
Triglycerides: 75 mg/dL (ref <150)

## 2019-09-24 LAB — VITAMIN B12: Vitamin B-12: 481 pg/mL (ref 200–1100)

## 2019-09-27 ENCOUNTER — Encounter: Payer: Self-pay | Admitting: Internal Medicine

## 2019-09-27 DIAGNOSIS — D649 Anemia, unspecified: Secondary | ICD-10-CM

## 2019-10-03 ENCOUNTER — Telehealth: Payer: Self-pay | Admitting: Physician Assistant

## 2019-10-03 NOTE — Telephone Encounter (Signed)
Received a new hem referral from Dr. Quay Burow for anemia. Belinda Williams has been cld and scheduled to see [name] on 7/22 at 11pm w/labs at 1230pm. Pt aware toa rrive 15 minutes early.

## 2019-10-04 ENCOUNTER — Inpatient Hospital Stay: Payer: BC Managed Care – PPO | Attending: Physician Assistant | Admitting: Physician Assistant

## 2019-10-04 ENCOUNTER — Telehealth: Payer: Self-pay | Admitting: Physician Assistant

## 2019-10-04 ENCOUNTER — Other Ambulatory Visit: Payer: Self-pay

## 2019-10-04 ENCOUNTER — Other Ambulatory Visit: Payer: Self-pay | Admitting: Physician Assistant

## 2019-10-04 ENCOUNTER — Inpatient Hospital Stay: Payer: BC Managed Care – PPO

## 2019-10-04 VITALS — BP 139/93 | HR 74 | Temp 97.5°F | Resp 18 | Ht 63.0 in | Wt 245.2 lb

## 2019-10-04 DIAGNOSIS — N920 Excessive and frequent menstruation with regular cycle: Secondary | ICD-10-CM | POA: Diagnosis not present

## 2019-10-04 DIAGNOSIS — D649 Anemia, unspecified: Secondary | ICD-10-CM

## 2019-10-04 DIAGNOSIS — Z79899 Other long term (current) drug therapy: Secondary | ICD-10-CM | POA: Insufficient documentation

## 2019-10-04 DIAGNOSIS — E538 Deficiency of other specified B group vitamins: Secondary | ICD-10-CM | POA: Diagnosis not present

## 2019-10-04 DIAGNOSIS — R5383 Other fatigue: Secondary | ICD-10-CM | POA: Diagnosis not present

## 2019-10-04 DIAGNOSIS — D509 Iron deficiency anemia, unspecified: Secondary | ICD-10-CM | POA: Diagnosis not present

## 2019-10-04 LAB — CBC WITH DIFFERENTIAL (CANCER CENTER ONLY)
Abs Immature Granulocytes: 0.02 10*3/uL (ref 0.00–0.07)
Basophils Absolute: 0 10*3/uL (ref 0.0–0.1)
Basophils Relative: 1 %
Eosinophils Absolute: 0.3 10*3/uL (ref 0.0–0.5)
Eosinophils Relative: 6 %
HCT: 35.4 % — ABNORMAL LOW (ref 36.0–46.0)
Hemoglobin: 10.7 g/dL — ABNORMAL LOW (ref 12.0–15.0)
Immature Granulocytes: 0 %
Lymphocytes Relative: 36 %
Lymphs Abs: 1.9 10*3/uL (ref 0.7–4.0)
MCH: 21.9 pg — ABNORMAL LOW (ref 26.0–34.0)
MCHC: 30.2 g/dL (ref 30.0–36.0)
MCV: 72.4 fL — ABNORMAL LOW (ref 80.0–100.0)
Monocytes Absolute: 0.3 10*3/uL (ref 0.1–1.0)
Monocytes Relative: 6 %
Neutro Abs: 2.7 10*3/uL (ref 1.7–7.7)
Neutrophils Relative %: 51 %
Platelet Count: 315 10*3/uL (ref 150–400)
RBC: 4.89 MIL/uL (ref 3.87–5.11)
RDW: 24.8 % — ABNORMAL HIGH (ref 11.5–15.5)
WBC Count: 5.3 10*3/uL (ref 4.0–10.5)
nRBC: 0 % (ref 0.0–0.2)

## 2019-10-04 LAB — CMP (CANCER CENTER ONLY)
ALT: 8 U/L (ref 0–44)
AST: 11 U/L — ABNORMAL LOW (ref 15–41)
Albumin: 3.4 g/dL — ABNORMAL LOW (ref 3.5–5.0)
Alkaline Phosphatase: 92 U/L (ref 38–126)
Anion gap: 10 (ref 5–15)
BUN: 11 mg/dL (ref 6–20)
CO2: 21 mmol/L — ABNORMAL LOW (ref 22–32)
Calcium: 9.5 mg/dL (ref 8.9–10.3)
Chloride: 107 mmol/L (ref 98–111)
Creatinine: 0.81 mg/dL (ref 0.44–1.00)
GFR, Est AFR Am: >60 mL/min (ref >60)
GFR, Estimated: >60 mL/min (ref >60)
Glucose, Bld: 85 mg/dL (ref 70–99)
Potassium: 4.1 mmol/L (ref 3.5–5.1)
Sodium: 138 mmol/L (ref 135–145)
Total Bilirubin: 0.3 mg/dL (ref 0.3–1.2)
Total Protein: 7.7 g/dL (ref 6.5–8.1)

## 2019-10-04 LAB — VITAMIN B12: Vitamin B-12: 397 pg/mL (ref 180–914)

## 2019-10-04 LAB — FOLATE: Folate: 8.4 ng/mL (ref >5.9)

## 2019-10-04 NOTE — Progress Notes (Signed)
[place] [phone]   [phone]  CONSULT NOTE  REFERRING PHYSICIAN: Dr. Quay Burow  REASON FOR CONSULTATION:  Anemia  HPI Belinda Williams is a 44 y.o. female with a past medical history significant for migraines, asthma, allergic rhinitis, iron deficiency anemia, B12 deficiency is referred to the clinic for evaluation of anemia.  The patient has been anemic "all her life". Per chart review, it appears that the patient has been anemic as far back as the records allow which is approximately 13 years ago or so. She saw her PCP for routine follow-up visit regarding exercise and weight loss. She had a CBC performed at this visit which noted persistent microcytic anemia with a hemoglobin of 9.7 and MCV of 71.4, and an RDW at 20.2. The patient has never required a blood transfusion or an iron transfusion. She takes oral iron supplements daily and has been compliant with this for over a year. She is not sure what the milligram dosage is of her over-the-counter iron supplements.   The patient reports heavy menstrual periods. For the most part, the patient has regular menstrual cycles lasting approximately 4 days. However, the patient states that her periods are heavy in which she wears pads as well as super and super plus tampons which she needs to change every 2-3 hours. She follows with [facility]. She was evaluated last month and was placed on birth control pills in hopes of decreasing the heavy menstrual period bleeding. Her last menstrual period was approximately 3 weeks ago. She has fibroids reportedly but none  Are significant enough to require any intervention. She denies any other abnormal bleeding or bruising such as nosebleeds, gingival bleeding, hemoptysis, hematemesis, or hematuria. The patient reports dark stools occasionally secondary to her iron supplement use. She denies any history of any bariatric surgeries. She denies any particular dietary  habits such as being a vegan or vegetarian. She has never had a colonoscopy due to not being the recommended age to qualify for the screening. The patient has a history of migraines in which she used to take [place] powder. She states that her migraines have not been as bad recently and she only needs to take [facility] powder 1 time every 2 months or so. She denies any abdominal pain, nausea, or vomiting. She denies any excessive or frequent ibuprofen/NSAID use. The patient states that she craves ice and frequently is eating ice chips.  The patient states that she "feels fine" today. She reports fatigue and feeling slightly more sluggish recently. She does not need to take naps for her energy. She denies any significant lightheadedness or dizziness. She denies any chest pain, shortness breath, or palpitations.  The patient denies any family history of hereditary causes of anemia such as sickle cell anemia or thalassemia. The patient's mother had hypertension, hypothyroidism, and is hard of hearing. The patient's father has obesity, hypertension, and prediabetes. The patient has a sister with hypothyroidism.  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 The patient is single does not have any children. The patient is a never smoker and denies drug use. She drinks approximately 1 or 2 alcoholic beverages per week, and prefers wine. HPI  Past Medical History:  Diagnosis Date  . ALLERGIC RHINITIS   . Anemia   . Asthma    as a child - no problem as adult  . Headache(784.0)   . Heart murmur    as a child - no problem as adult - closed  per pt  . Lactose intolerance   . Lower back pain   . MIGRAINE, MENSTRUAL   . Morbid obesity ([place])   . PAP SMEAR, ABNORMAL 08/23/2006  . Thyroglossal duct cyst    Excision June 2013 by ENT (bates)  . Vitamin B 12 deficiency   . Vitamin D deficiency   . Weight gain     Past Surgical History:  Procedure Laterality Date  . DERMOID CYST  EXCISION  08/2011   Neck    . DILATATION & CURRETTAGE/HYSTEROSCOPY WITH RESECTOCOPE  02/2012  . thyroductal cyst    . vaginal cyst removed      Family History  Problem Relation Age of Onset  . Thyroid disease Sister   . Breast cancer Maternal Aunt   . Cancer Maternal Aunt        breast  . Diabetes Maternal Grandmother   . Hypertension Maternal Grandmother   . Stroke Maternal Grandmother   . Hypertension Other   . Hypertension Mother   . Sleep apnea Father   . Obesity Father     Social History Social History   Tobacco Use  . Smoking status: Never Smoker  . Smokeless tobacco: Never Used  Substance Use Topics  . Alcohol use: Yes    Comment: socially  . Drug use: No    Allergies  Allergen Reactions  . Sulfonamide Derivatives Nausea Only  . Ciprofloxacin Nausea Only and Rash  . Penicillins Nausea Only and Rash    Tolerates Amoxicillin OK    Current Outpatient Medications  Medication Sig Dispense Refill  . Aspirin-Acetaminophen (GOODY BODY PAIN) 500-325 MG PACK Take by mouth as needed.      . Drospirenone (SLYND) 4 MG TABS Take by mouth.    . Iron-Vitamin C (IRON 100/C) 100-250 MG TABS      No current facility-administered medications for this visit.    Review of Systems Review of Systems  Constitutional: Positive for fatigue. Negative for appetite change, chills, fever and unexpected weight change.  HENT: Negative for mouth sores, nosebleeds, sore throat and trouble swallowing.   Eyes: Negative for eye problems and icterus.  Respiratory: Negative for cough, hemoptysis, shortness of breath and wheezing.   Cardiovascular: Negative for chest pain and leg swelling.  Gastrointestinal: Negative for abdominal pain, constipation, diarrhea, nausea and vomiting.  Genitourinary: Negative for bladder incontinence, difficulty urinating, dysuria, frequency and hematuria.   Musculoskeletal: Negative for back pain, gait problem, neck pain and neck stiffness.  Skin: Negative for itching and rash.   Neurological: Negative for dizziness, extremity weakness, gait problem, headaches, light-headedness and seizures.  Hematological: Positive for menorrhagia. Negative for adenopathy. Does not bruise/bleed easily.  Psychiatric/Behavioral: Negative for confusion, depression and sleep disturbance. The patient is not nervous/anxious.     PHYSICAL EXAMINATION:  Blood pressure (!) 139/93, pulse 74, temperature (!) 97.5 F (36.4 C), temperature source Temporal, resp. rate 18, height 5\' 3"  (1.6 m), weight (!) 245 lb 3.2 oz (111.2 kg), SpO2 100 %.  ECOG PERFORMANCE STATUS: 1   Physical Exam  Constitutional: Oriented to person, place, and time and well-developed, well-nourished, and in no distress.  HENT:  Head: Normocephalic and atraumatic.  Mouth/Throat: Oropharynx is clear and moist. No oropharyngeal exudate.  Eyes: Conjunctivae are normal. Right eye exhibits no discharge. Left eye exhibits no discharge. No scleral icterus.  Neck: Normal range of motion. Neck supple.  Cardiovascular: Normal rate, regular rhythm, normal heart sounds and intact distal pulses.   Pulmonary/Chest: Effort normal and breath sounds normal.  No respiratory distress. No wheezes. No rales.  Abdominal: Soft. Bowel sounds are normal. Exhibits no distension and no mass. There is no tenderness.  Musculoskeletal: Normal range of motion. Exhibits no edema.  Lymphadenopathy:    No cervical adenopathy.  Neurological: Alert and oriented to person, place, and time. Exhibits normal muscle tone. Gait normal. Coordination normal.  Skin: Skin is warm and dry. No rash noted. Not diaphoretic. No erythema. No pallor.  Psychiatric: Mood, memory and judgment normal.  Vitals reviewed.  LABORATORY DATA: Lab Results  Component Value Date   WBC 5.3 10/04/2019   HGB 10.7 (L) 10/04/2019   HCT 35.4 (L) 10/04/2019   MCV 72.4 (L) 10/04/2019   PLT 315 10/04/2019      Chemistry      Component Value Date/Time   NA 138 10/04/2019 1241   NA  139 12/22/2017 0857   K 4.1 10/04/2019 1241   CL 107 10/04/2019 1241   CO2 21 (L) 10/04/2019 1241   BUN 11 10/04/2019 1241   BUN 10 12/22/2017 0857   CREATININE 0.81 10/04/2019 1241   CREATININE 0.86 09/21/2019 1549      Component Value Date/Time   CALCIUM 9.5 10/04/2019 1241   ALKPHOS 92 10/04/2019 1241   AST 11 (L) 10/04/2019 1241   ALT 8 10/04/2019 1241   BILITOT 0.3 10/04/2019 1241       RADIOGRAPHIC STUDIES: No results found.  ASSESSMENT: This is a very pleasant 44 year old African-American female referred to the clinic for evaluation of anemia likely secondary to heavy menstrual bleeding.   PLAN: The patient was seen with Dr. Julien Nordmann today. The patient had a CBC, CMP, folate, B12, iron studies, and ferritin performed today. She also had a SPEP with immunofixation. The patient CBC demonstrates persistent microcytic anemia with a hemoglobin of 10.7 and a low MCV and high RDW. Her other lab studies are still pending at this time.  We will arrange for the patient to receive IV iron with Venofer x4 weekly. We will arrange for her to have her first infusion on 10/12/2019. She was also advised to continue taking her iron supplements to maintain her iron stores.  If the patient continues to have persistent anemia despite IV iron, we may consider checking the patient for hereditary causes of anemia.  We'll see the patient back for follow-up visit in 6 weeks for evaluation and to repeat her CBC, iron studies, and ferritin.  She will continue to follow with her OB/GYN regarding her heavy menstrual periods.  The patient voices understanding of current disease status and treatment options and is in agreement with the current care plan.  All questions were answered. The patient knows to call the clinic with any problems, questions or concerns. We can certainly see the patient much sooner if necessary.  Thank you so much for allowing me to participate in the care of Ebelyn Weckwerth. I  will continue to follow up the patient with you and assist in her care.  The total time spent in the appointment was 60 minutes.  Disclaimer: This note was dictated with voice recognition software. Similar sounding words can inadvertently be transcribed and may not be corrected upon review.   Daniyah Fohl L Kersten Salmons October 04, 2019, 1:48 PM  ADDENDUM: Hematology/Oncology Attending: I had a face-to-face encounter with the patient.  I recommended her care plan.  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  The patient has been on oral  iron tablets for at least a year with no significant improvement in her anemia or iron deficiency. We will order several studies for evaluation of her anemia today. I recommended for the patient to receive iron infusion with Venofer weekly for 4 weeks. We will see her back for follow-up visit in 6 weeks for evaluation and repeat CBC, iron study and ferritin. She was also advised to follow-up with her gynecologist for management of her heavy menstrual period. If no improvement in her microcytic anemia after the iron infusion, will check her hemoglobin electrophoresis to rule out any hemoglobinopathy. The patient was advised to call immediately if she has any concerning symptoms in the interval.  Disclaimer: This note was dictated with voice recognition software. Similar sounding words can inadvertently be transcribed and may be missed upon review. Eilleen Kempf, MD 10/05/19

## 2019-10-04 NOTE — Telephone Encounter (Signed)
Scheduled per los. Patient declined printout  

## 2019-10-05 ENCOUNTER — Encounter: Payer: Self-pay | Admitting: Physician Assistant

## 2019-10-05 LAB — IRON AND TIBC
Iron: 42 ug/dL (ref 41–142)
Saturation Ratios: 12 % — ABNORMAL LOW (ref 21–57)
TIBC: 351 ug/dL (ref 236–444)
UIBC: 309 ug/dL (ref 120–384)

## 2019-10-05 LAB — FERRITIN: Ferritin: 18 ng/mL (ref 11–307)

## 2019-10-08 LAB — PROTEIN ELECTROPHORESIS, SERUM
A/G Ratio: 0.9 (ref 0.7–1.7)
Albumin ELP: 3.3 g/dL (ref 2.9–4.4)
Alpha-1-Globulin: 0.2 g/dL (ref 0.0–0.4)
Alpha-2-Globulin: 0.7 g/dL (ref 0.4–1.0)
Beta Globulin: 1.4 g/dL — ABNORMAL HIGH (ref 0.7–1.3)
Gamma Globulin: 1.3 g/dL (ref 0.4–1.8)
Globulin, Total: 3.7 g/dL (ref 2.2–3.9)
Total Protein ELP: 7 g/dL (ref 6.0–8.5)

## 2019-10-12 ENCOUNTER — Other Ambulatory Visit: Payer: Self-pay

## 2019-10-12 ENCOUNTER — Inpatient Hospital Stay: Payer: BC Managed Care – PPO

## 2019-10-12 VITALS — BP 127/76 | HR 78 | Temp 97.8°F | Resp 20

## 2019-10-12 DIAGNOSIS — D509 Iron deficiency anemia, unspecified: Secondary | ICD-10-CM | POA: Diagnosis not present

## 2019-10-12 MED ORDER — DIPHENHYDRAMINE HCL 25 MG PO CAPS
ORAL_CAPSULE | ORAL | Status: AC
  Filled 2019-10-12: qty 2

## 2019-10-12 MED ORDER — SODIUM CHLORIDE 0.9 % IV SOLN
Freq: Once | INTRAVENOUS | Status: AC
  Filled 2019-10-12: qty 250

## 2019-10-12 MED ORDER — ACETAMINOPHEN 325 MG PO TABS
ORAL_TABLET | ORAL | Status: AC
  Filled 2019-10-12: qty 2

## 2019-10-12 MED ORDER — SODIUM CHLORIDE 0.9 % IV SOLN
200.0000 mg | Freq: Once | INTRAVENOUS | Status: AC
  Administered 2019-10-12: 200 mg via INTRAVENOUS
  Filled 2019-10-12: qty 200

## 2019-10-12 MED ORDER — ACETAMINOPHEN 325 MG PO TABS
650.0000 mg | ORAL_TABLET | Freq: Once | ORAL | Status: AC
  Administered 2019-10-12: 650 mg via ORAL

## 2019-10-12 MED ORDER — DIPHENHYDRAMINE HCL 25 MG PO CAPS
50.0000 mg | ORAL_CAPSULE | Freq: Once | ORAL | Status: AC
  Administered 2019-10-12: 50 mg via ORAL

## 2019-10-12 NOTE — Patient Instructions (Signed)

## 2019-10-19 ENCOUNTER — Inpatient Hospital Stay: Payer: BC Managed Care – PPO

## 2019-10-19 ENCOUNTER — Inpatient Hospital Stay: Payer: BC Managed Care – PPO | Attending: Physician Assistant

## 2019-10-19 ENCOUNTER — Other Ambulatory Visit: Payer: Self-pay

## 2019-10-19 VITALS — BP 120/79 | HR 67 | Temp 98.2°F | Resp 16

## 2019-10-19 DIAGNOSIS — D509 Iron deficiency anemia, unspecified: Secondary | ICD-10-CM | POA: Insufficient documentation

## 2019-10-19 MED ORDER — DIPHENHYDRAMINE HCL 25 MG PO CAPS
50.0000 mg | ORAL_CAPSULE | Freq: Once | ORAL | Status: AC
  Administered 2019-10-19: 50 mg via ORAL

## 2019-10-19 MED ORDER — ACETAMINOPHEN 325 MG PO TABS
ORAL_TABLET | ORAL | Status: AC
  Filled 2019-10-19: qty 2

## 2019-10-19 MED ORDER — DIPHENHYDRAMINE HCL 25 MG PO CAPS
ORAL_CAPSULE | ORAL | Status: AC
  Filled 2019-10-19: qty 2

## 2019-10-19 MED ORDER — SODIUM CHLORIDE 0.9 % IV SOLN
200.0000 mg | Freq: Once | INTRAVENOUS | Status: AC
  Administered 2019-10-19: 200 mg via INTRAVENOUS
  Filled 2019-10-19: qty 200

## 2019-10-19 MED ORDER — ACETAMINOPHEN 325 MG PO TABS
650.0000 mg | ORAL_TABLET | Freq: Once | ORAL | Status: AC
  Administered 2019-10-19: 650 mg via ORAL

## 2019-10-19 MED ORDER — SODIUM CHLORIDE 0.9 % IV SOLN
Freq: Once | INTRAVENOUS | Status: AC
  Filled 2019-10-19: qty 250

## 2019-10-26 ENCOUNTER — Other Ambulatory Visit: Payer: Self-pay

## 2019-10-26 ENCOUNTER — Inpatient Hospital Stay: Payer: BC Managed Care – PPO

## 2019-10-26 VITALS — BP 120/79 | HR 70 | Temp 97.9°F | Resp 18

## 2019-10-26 DIAGNOSIS — D509 Iron deficiency anemia, unspecified: Secondary | ICD-10-CM

## 2019-10-26 MED ORDER — SODIUM CHLORIDE 0.9 % IV SOLN
200.0000 mg | Freq: Once | INTRAVENOUS | Status: AC
  Administered 2019-10-26: 200 mg via INTRAVENOUS
  Filled 2019-10-26: qty 200

## 2019-10-26 MED ORDER — SODIUM CHLORIDE 0.9 % IV SOLN
Freq: Once | INTRAVENOUS | Status: AC
  Filled 2019-10-26: qty 250

## 2019-10-26 MED ORDER — ACETAMINOPHEN 325 MG PO TABS
650.0000 mg | ORAL_TABLET | Freq: Once | ORAL | Status: AC
  Administered 2019-10-26: 650 mg via ORAL

## 2019-10-26 MED ORDER — DIPHENHYDRAMINE HCL 25 MG PO CAPS
50.0000 mg | ORAL_CAPSULE | Freq: Once | ORAL | Status: AC
  Administered 2019-10-26: 50 mg via ORAL

## 2019-10-26 MED ORDER — DIPHENHYDRAMINE HCL 25 MG PO CAPS
ORAL_CAPSULE | ORAL | Status: AC
  Filled 2019-10-26: qty 2

## 2019-10-26 MED ORDER — ACETAMINOPHEN 325 MG PO TABS
ORAL_TABLET | ORAL | Status: AC
  Filled 2019-10-26: qty 2

## 2019-10-26 NOTE — Patient Instructions (Signed)

## 2019-11-02 ENCOUNTER — Other Ambulatory Visit: Payer: Self-pay

## 2019-11-02 ENCOUNTER — Inpatient Hospital Stay: Payer: BC Managed Care – PPO

## 2019-11-02 VITALS — BP 119/67 | HR 66 | Temp 98.5°F | Resp 18

## 2019-11-02 DIAGNOSIS — D509 Iron deficiency anemia, unspecified: Secondary | ICD-10-CM

## 2019-11-02 MED ORDER — ACETAMINOPHEN 325 MG PO TABS
650.0000 mg | ORAL_TABLET | Freq: Once | ORAL | Status: AC
  Administered 2019-11-02: 650 mg via ORAL

## 2019-11-02 MED ORDER — ACETAMINOPHEN 325 MG PO TABS
ORAL_TABLET | ORAL | Status: AC
  Filled 2019-11-02: qty 2

## 2019-11-02 MED ORDER — SODIUM CHLORIDE 0.9 % IV SOLN
Freq: Once | INTRAVENOUS | Status: AC
  Filled 2019-11-02: qty 250

## 2019-11-02 MED ORDER — SODIUM CHLORIDE 0.9 % IV SOLN
200.0000 mg | Freq: Once | INTRAVENOUS | Status: AC
  Administered 2019-11-02: 200 mg via INTRAVENOUS
  Filled 2019-11-02: qty 200

## 2019-11-02 MED ORDER — DIPHENHYDRAMINE HCL 25 MG PO CAPS
50.0000 mg | ORAL_CAPSULE | Freq: Once | ORAL | Status: AC
  Administered 2019-11-02: 50 mg via ORAL

## 2019-11-02 MED ORDER — DIPHENHYDRAMINE HCL 25 MG PO CAPS
ORAL_CAPSULE | ORAL | Status: AC
  Filled 2019-11-02: qty 2

## 2019-11-02 NOTE — Progress Notes (Signed)
Pt declined 30-minute post-observation after venofer 

## 2019-11-02 NOTE — Patient Instructions (Signed)

## 2019-11-11 NOTE — Progress Notes (Deleted)
[place] OFFICE PROGRESS NOTE  Binnie Rail, MD [place] [address]  DIAGNOSIS: Iron deficiency Anemia secondary to menorrhagia  PRIOR THERAPY: None  CURRENT THERAPY: 1) Oral iron supplements p.o. daily 2) IV iron with venofer PRN  INTERVAL HISTORY: Jaquaya Coyle 44 y.o. female returns to the clinic today for a follow up visit. The patient was referred to the clinic for evaluation of iron deficiency anemia secondary to menorrhagia. Her OBGYN put her on oral birth control pills recently which _her menstrual bleeding. Her cycles are now (lighter?). She is compliant with her oral iron supplement. She received IV iron with venofer and tolerated it well. Since receiving iron, she feels _. Her energy is _. She denies any other bleeding or bruising including _. She is here for evaluation and a repeat CBC, iron studies, and ferritin.    MEDICAL HISTORY: Past Medical History:  Diagnosis Date  . ALLERGIC RHINITIS   . Anemia   . Asthma    as a child - no problem as adult  . Headache(784.0)   . Heart murmur    as a child - no problem as adult - closed per pt  . Lactose intolerance   . Lower back pain   . MIGRAINE, MENSTRUAL   . Morbid obesity ([place])   . PAP SMEAR, ABNORMAL 08/23/2006  . Thyroglossal duct cyst    Excision June 2013 by ENT ([facility])  . Vitamin B 12 deficiency   . Vitamin D deficiency   . Weight gain     ALLERGIES:  is allergic to sulfonamide derivatives, ciprofloxacin, and penicillins.  MEDICATIONS:  Current Outpatient Medications  Medication Sig Dispense Refill  . Aspirin-Acetaminophen (GOODY BODY PAIN) 500-325 MG PACK Take by mouth as needed.      . Drospirenone (SLYND) 4 MG TABS Take by mouth.    . Iron-Vitamin C (IRON 100/C) 100-250 MG TABS      No current facility-administered medications for this visit.    SURGICAL HISTORY:  Past Surgical History:  Procedure Laterality Date  . DERMOID CYST  EXCISION  08/2011   Neck   . DILATATION & CURRETTAGE/HYSTEROSCOPY WITH RESECTOCOPE  02/2012  . thyroductal cyst    . vaginal cyst removed      REVIEW OF SYSTEMS:   Review of Systems  Constitutional: Negative for appetite change, chills, fatigue, fever and unexpected weight change.  HENT:   Negative for mouth sores, nosebleeds, sore throat and trouble swallowing.   Eyes: Negative for eye problems and icterus.  Respiratory: Negative for cough, hemoptysis, shortness of breath and wheezing.   Cardiovascular: Negative for chest pain and leg swelling.  Gastrointestinal: Negative for abdominal pain, constipation, diarrhea, nausea and vomiting.  Genitourinary: Negative for bladder incontinence, difficulty urinating, dysuria, frequency and hematuria.   Musculoskeletal: Negative for back pain, gait problem, neck pain and neck stiffness.  Skin: Negative for itching and rash.  Neurological: Negative for dizziness, extremity weakness, gait problem, headaches, light-headedness and seizures.  Hematological: Negative for adenopathy. Does not bruise/bleed easily.  Psychiatric/Behavioral: Negative for confusion, depression and sleep disturbance. The patient is not nervous/anxious.     PHYSICAL EXAMINATION:  There were no vitals taken for this visit.  ECOG PERFORMANCE STATUS: {CHL ONC ECOG [id number]  Physical Exam  Constitutional: Oriented to person, place, and time and well-developed, well-nourished, and in no distress. No distress.  HENT:  Head: Normocephalic and atraumatic.  Mouth/Throat: Oropharynx is clear and moist. No oropharyngeal exudate.  Eyes: Conjunctivae are  normal. Right eye exhibits no discharge. Left eye exhibits no discharge. No scleral icterus.  Neck: Normal range of motion. Neck supple.  Cardiovascular: Normal rate, regular rhythm, normal heart sounds and intact distal pulses.   Pulmonary/Chest: Effort normal and breath sounds normal. No respiratory distress. No wheezes. No rales.  Abdominal: Soft.  Bowel sounds are normal. Exhibits no distension and no mass. There is no tenderness.  Musculoskeletal: Normal range of motion. Exhibits no edema.  Lymphadenopathy:    No cervical adenopathy.  Neurological: Alert and oriented to person, place, and time. Exhibits normal muscle tone. Gait normal. Coordination normal.  Skin: Skin is warm and dry. No rash noted. Not diaphoretic. No erythema. No pallor.  Psychiatric: Mood, memory and judgment normal.  Vitals reviewed.  LABORATORY DATA: Lab Results  Component Value Date   WBC 5.3 10/04/2019   HGB 10.7 (L) 10/04/2019   HCT 35.4 (L) 10/04/2019   MCV 72.4 (L) 10/04/2019   PLT 315 10/04/2019      Chemistry      Component Value Date/Time   NA 138 10/04/2019 1241   NA 139 12/22/2017 0857   K 4.1 10/04/2019 1241   CL 107 10/04/2019 1241   CO2 21 (L) 10/04/2019 1241   BUN 11 10/04/2019 1241   BUN 10 12/22/2017 0857   CREATININE 0.81 10/04/2019 1241   CREATININE 0.86 09/21/2019 1549      Component Value Date/Time   CALCIUM 9.5 10/04/2019 1241   ALKPHOS 92 10/04/2019 1241   AST 11 (L) 10/04/2019 1241   ALT 8 10/04/2019 1241   BILITOT 0.3 10/04/2019 1241       RADIOGRAPHIC STUDIES:  No results found.   ASSESSMENT/PLAN:  This is a very pleasant 44 year old African-American female referred to the clinic for evaluation of anemia likely secondary to heavy menstrual bleeding.  The patient had a repeat CBC, iron studies, and ferritin performed which showed.   The patient was seen with Dr. Julien Nordmann. Her Hgb is _ today. Her iron and ferritin is _.   Dr. Julien Nordmann recommends.   We will see her back for a follow up visit in [facility] for evaluation and repeat CBC, iron studies, and ferritin.   She will continue to take her oral iron supplement.   She will continue to follow with her OBGYN for her menorrhagia.   The patient was advised to call immediately if she has any concerning symptoms in the interval. The patient voices understanding  of current disease status and treatment options and is in agreement with the current care plan. All questions were answered. The patient knows to call the clinic with any problems, questions or concerns. We can certainly see the patient much sooner if necessary      No orders of the defined types were placed in this encounter.    Rashied Corallo L Aryan Bello, PA-C 11/11/19

## 2019-11-15 ENCOUNTER — Inpatient Hospital Stay: Payer: BC Managed Care – PPO

## 2019-11-15 ENCOUNTER — Inpatient Hospital Stay: Payer: BC Managed Care – PPO | Admitting: Physician Assistant

## 2019-11-24 NOTE — Progress Notes (Signed)
[facility] OFFICE PROGRESS NOTE  Binnie Rail, MD [place] [id number]  DIAGNOSIS: Anemia likely secondary to heavy menstrual bleeding  PRIOR THERAPY: None  CURRENT THERAPY:  1) Oral iron supplement p.o. daily 2) IV iron with Venofer PRN. Last dose 11/02/19  INTERVAL HISTORY: Belinda Williams 44 y.o. female returns to the clinic today for a follow up visit. The patient is feeling fairly well today without any concerning complaints. The patient was initially referred to the clinic for evaluation of iron deficiency anemia. She reports heavy menstural periods and her OBGYN placed her on birth control pills which had not helped her bleeding. She reports when she started her cycle, it lasted 2.5 weeks or so. They are planning on starting her on another birth control, but the patient is hesitant to take it since it is expected to completely stop her cycles, which the patient is not comfortable with. She is compliant with an oral iron supplement but she does not know the milligram dosage. She received IV iron with venofer and tolerated this well. Since receiving IV iron her fatigue is unchanged. She denies any other abnormal bleeding or bruising such as nosebleeds, gingival bleeding, hemoptysis, hematemesis, or hematuria. The patient reports dark stools occasionally secondary to her iron supplement use. She denies any significant lightheadedness or dizziness. She denies any chest pain, shortness breath, or palpitations. She is here for evaluation and repeat iron studies, ferritin, and CBC.     MEDICAL HISTORY: Past Medical History:  Diagnosis Date  . ALLERGIC RHINITIS   . Anemia   . Asthma    as a child - no problem as adult  . Headache(784.0)   . Heart murmur    as a child - no problem as adult - closed per pt  . Lactose intolerance   . Lower back pain   . MIGRAINE, MENSTRUAL   . Morbid obesity ([place])   . PAP SMEAR, ABNORMAL 08/23/2006  . Thyroglossal duct  cyst    Excision June 2013 by ENT ([facility])  . Vitamin B 12 deficiency   . Vitamin D deficiency   . Weight gain     ALLERGIES:  is allergic to sulfonamide derivatives, ciprofloxacin, and penicillins.  MEDICATIONS:  Current Outpatient Medications  Medication Sig Dispense Refill  . Aspirin-Acetaminophen (GOODY BODY PAIN) 500-325 MG PACK Take by mouth as needed.      . Drospirenone (SLYND) 4 MG TABS Take by mouth.    . Iron-Vitamin C (IRON 100/C) 100-250 MG TABS      No current facility-administered medications for this visit.    SURGICAL HISTORY:  Past Surgical History:  Procedure Laterality Date  . DERMOID CYST  EXCISION  08/2011   Neck  . DILATATION & CURRETTAGE/HYSTEROSCOPY WITH RESECTOCOPE  02/2012  . thyroductal cyst    . vaginal cyst removed      REVIEW OF SYSTEMS:   Review of Systems  Constitutional: Positive for fatigue. Negative for appetite change, chills, fever and unexpected weight change.  HENT: Negative for mouth sores, nosebleeds, sore throat and trouble swallowing.   Eyes: Negative for eye problems and icterus.  Respiratory: Negative for cough, hemoptysis, shortness of breath and wheezing.   Cardiovascular: Negative for chest pain and leg swelling.  Gastrointestinal: Negative for abdominal pain, constipation, diarrhea, nausea and vomiting.  Genitourinary: Negative for bladder incontinence, difficulty urinating, dysuria, frequency and hematuria.   Musculoskeletal: Negative for back pain, gait problem, neck pain and neck stiffness.  Skin: Negative  for itching and rash.  Neurological: Negative for dizziness, extremity weakness, gait problem, headaches, light-headedness and seizures.  Hematological: Positive for menorrhagia. Negative for adenopathy. Does not bruise/bleed easily.  Psychiatric/Behavioral: Negative for confusion, depression and sleep disturbance. The patient is not nervous/anxious.     PHYSICAL EXAMINATION:  There were no vitals taken for this  visit.  ECOG PERFORMANCE STATUS: 1 - Symptomatic but completely ambulatory  Physical Exam  Constitutional: Oriented to person, place, and time and well-developed, well-nourished, and in no distress. HENT:  Head: Normocephalic and atraumatic.  Mouth/Throat: Oropharynx is clear and moist. No oropharyngeal exudate.  Eyes: Conjunctivae are normal. Right eye exhibits no discharge. Left eye exhibits no discharge. No scleral icterus.  Neck: Normal range of motion. Neck supple.  Cardiovascular: Normal rate, regular rhythm, normal heart sounds and intact distal pulses.  Pulmonary/Chest: Effort normal and breath sounds normal. No respiratory distress. No wheezes. No rales.  Abdominal: Soft. Bowel sounds are normal. Exhibits no distension and no mass. There is no tenderness.  Musculoskeletal: Normal range of motion. Exhibits no edema.  Lymphadenopathy:    No cervical adenopathy.  Neurological: Alert and oriented to person, place, and time. Exhibits normal muscle tone. Gait normal. Coordination normal.  Skin: Skin is warm and dry. No rash noted. Not diaphoretic. No erythema. No pallor.  Psychiatric: Mood, memory and judgment normal.  Vitals reviewed.  LABORATORY DATA: Lab Results  Component Value Date   WBC 5.3 10/04/2019   HGB 10.7 (L) 10/04/2019   HCT 35.4 (L) 10/04/2019   MCV 72.4 (L) 10/04/2019   PLT 315 10/04/2019      Chemistry      Component Value Date/Time   NA 138 10/04/2019 1241   NA 139 12/22/2017 0857   K 4.1 10/04/2019 1241   CL 107 10/04/2019 1241   CO2 21 (L) 10/04/2019 1241   BUN 11 10/04/2019 1241   BUN 10 12/22/2017 0857   CREATININE 0.81 10/04/2019 1241   CREATININE 0.86 09/21/2019 1549      Component Value Date/Time   CALCIUM 9.5 10/04/2019 1241   ALKPHOS 92 10/04/2019 1241   AST 11 (L) 10/04/2019 1241   ALT 8 10/04/2019 1241   BILITOT 0.3 10/04/2019 1241       RADIOGRAPHIC STUDIES:  No results found.   ASSESSMENT/PLAN:  This is a very pleasant  44 year old African-American female referred to the clinic for evaluation of anemia likely secondary to heavy menstrual bleeding.   She had a repeat CBC. Iron studies, and ferritin performed today. Her CBC showed a hbg of 12.1. Her iron studies are WNL.  We will see her back for a follow up visit in 3 months for evaluation and a repeat CBC, iron studies, and ferritin.   She will continue with her oral iron tablet for now.   She will continue to follow with her OBGYN regarding management of her heavy menstrual periods.   The patient was advised to call immediately if she has any concerning symptoms in the interval. The patient voices understanding of current disease status and treatment options and is in agreement with the current care plan. All questions were answered. The patient knows to call the clinic with any problems, questions or concerns. We can certainly see the patient much sooner if necessary      No orders of the defined types were placed in this encounter.    Quintan Saldivar L Tully Mcinturff, PA-C 11/24/19

## 2019-11-26 ENCOUNTER — Other Ambulatory Visit: Payer: Self-pay

## 2019-11-26 ENCOUNTER — Inpatient Hospital Stay: Payer: BC Managed Care – PPO | Attending: Physician Assistant

## 2019-11-26 ENCOUNTER — Inpatient Hospital Stay (HOSPITAL_BASED_OUTPATIENT_CLINIC_OR_DEPARTMENT_OTHER): Payer: BC Managed Care – PPO | Admitting: Physician Assistant

## 2019-11-26 VITALS — BP 132/88 | HR 85 | Temp 97.8°F | Resp 18 | Ht 63.0 in | Wt 251.1 lb

## 2019-11-26 DIAGNOSIS — Z79899 Other long term (current) drug therapy: Secondary | ICD-10-CM | POA: Diagnosis not present

## 2019-11-26 DIAGNOSIS — E538 Deficiency of other specified B group vitamins: Secondary | ICD-10-CM | POA: Diagnosis not present

## 2019-11-26 DIAGNOSIS — Z7982 Long term (current) use of aspirin: Secondary | ICD-10-CM | POA: Insufficient documentation

## 2019-11-26 DIAGNOSIS — N920 Excessive and frequent menstruation with regular cycle: Secondary | ICD-10-CM

## 2019-11-26 DIAGNOSIS — D649 Anemia, unspecified: Secondary | ICD-10-CM

## 2019-11-26 DIAGNOSIS — R5383 Other fatigue: Secondary | ICD-10-CM | POA: Insufficient documentation

## 2019-11-26 DIAGNOSIS — D509 Iron deficiency anemia, unspecified: Secondary | ICD-10-CM | POA: Diagnosis present

## 2019-11-26 LAB — CBC WITH DIFFERENTIAL (CANCER CENTER ONLY)
Abs Immature Granulocytes: 0.01 10*3/uL (ref 0.00–0.07)
Basophils Absolute: 0 10*3/uL (ref 0.0–0.1)
Basophils Relative: 1 %
Eosinophils Absolute: 0.3 10*3/uL (ref 0.0–0.5)
Eosinophils Relative: 8 %
HCT: 38.7 % (ref 36.0–46.0)
Hemoglobin: 12.1 g/dL (ref 12.0–15.0)
Immature Granulocytes: 0 %
Lymphocytes Relative: 37 %
Lymphs Abs: 1.6 10*3/uL (ref 0.7–4.0)
MCH: 25.3 pg — ABNORMAL LOW (ref 26.0–34.0)
MCHC: 31.3 g/dL (ref 30.0–36.0)
MCV: 80.8 fL (ref 80.0–100.0)
Monocytes Absolute: 0.2 10*3/uL (ref 0.1–1.0)
Monocytes Relative: 6 %
Neutro Abs: 2.1 10*3/uL (ref 1.7–7.7)
Neutrophils Relative %: 48 %
Platelet Count: 287 10*3/uL (ref 150–400)
RBC: 4.79 MIL/uL (ref 3.87–5.11)
RDW: 24 % — ABNORMAL HIGH (ref 11.5–15.5)
WBC Count: 4.2 10*3/uL (ref 4.0–10.5)
nRBC: 0 % (ref 0.0–0.2)

## 2019-11-26 LAB — IRON AND TIBC
Iron: 61 ug/dL (ref 41–142)
Saturation Ratios: 22 % (ref 21–57)
TIBC: 281 ug/dL (ref 236–444)
UIBC: 221 ug/dL (ref 120–384)

## 2019-11-26 LAB — FERRITIN: Ferritin: 84 ng/mL (ref 11–307)

## 2019-12-10 ENCOUNTER — Telehealth: Payer: Self-pay | Admitting: Physician Assistant

## 2019-12-10 NOTE — Telephone Encounter (Signed)
Called and spoke with patient. Confirmed dec appts.

## 2020-02-22 ENCOUNTER — Ambulatory Visit: Payer: BC Managed Care – PPO | Attending: Family

## 2020-02-22 DIAGNOSIS — Z23 Encounter for immunization: Secondary | ICD-10-CM

## 2020-02-25 ENCOUNTER — Other Ambulatory Visit: Payer: BC Managed Care – PPO

## 2020-02-25 ENCOUNTER — Ambulatory Visit: Payer: BC Managed Care – PPO | Admitting: Internal Medicine

## 2020-06-26 NOTE — Progress Notes (Signed)
   Covid-19 Vaccination Clinic  Name:  Belinda Williams    MRN: [id number] DOB: [birth year]  06/26/2020  [name] was observed post Covid-19 immunization for 15 minutes without incident. She was provided with Vaccine Information Sheet and instruction to access the V-Safe system.   [name] was instructed to call 911 with any severe reactions post vaccine: Marland Kitchen Difficulty breathing  . Swelling of face and throat  . A fast heartbeat  . A bad rash all over body  . Dizziness and weakness   Immunizations Administered    Name Date Dose VIS Date Route   Moderna Covid-19 Booster Vaccine 02/22/2020 11:00 AM 0.25 mL 01/02/2020 Intramuscular   Manufacturer: Moderna   Lot: [id number]   [place]: 90383-338-32

## 2020-09-03 ENCOUNTER — Encounter: Payer: Self-pay | Admitting: Physician Assistant

## 2020-09-04 ENCOUNTER — Encounter: Payer: Self-pay | Admitting: Internal Medicine

## 2020-09-04 NOTE — Progress Notes (Signed)
Subjective:    Patient ID: Belinda Williams, female    DOB: [birth year], 45 y.o.   MRN: [id number]  HPI The patient is here for an acute visit.   Right knee pain - sudden feeling like something pulled in right knee - occurred when walking dog.  No twisting knee or injury.  Knee started hurting when she got home from the walk.  Started about 1 week ago.  Has gotten a little better.  Some swelling.  Taken asa, aspercream and biofreeze, heating pad, cold - no improvement.  No prior issues with knee.    Migraines -  typically 1 migraine a month with her period.  she she has not had many migraines for a while and usually controlled with otc meds- they have been worse in the past month.  Nothing works - bc powder, advil, tylenol, excedrin migraine.   Maxalt and imitrex did not work in the past.   Medications and allergies reviewed with patient and updated if appropriate.  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       Iron-Vitamin C (IRON 100/C) 100-250 MG TABS      No current facility-administered medications on file prior to visit.    Past Medical History:  Diagnosis  Date   ALLERGIC RHINITIS    Anemia    Asthma    as a child - no problem as adult   Headache(784.0)    Heart murmur    as a child - no problem as adult - closed per pt   Lactose intolerance    Lower back pain    MIGRAINE, MENSTRUAL    Morbid obesity ([place])    PAP SMEAR, ABNORMAL 08/23/2006   Thyroglossal duct cyst    Excision June 2013 by ENT ([facility])   Vitamin B 12 deficiency    Vitamin D deficiency    Weight gain     Past Surgical History:  Procedure Laterality Date   DERMOID CYST  EXCISION  08/2011   Neck   DILATATION & CURRETTAGE/HYSTEROSCOPY WITH RESECTOCOPE  02/2012   thyroductal cyst     vaginal cyst removed      Social History   Socioeconomic History   Marital status: Single    Spouse name: Not on file   Number of children: Not on file   Years of education: Not on file   Highest education level: Not on file  Occupational History   Occupation: Mudlogger of social activities   Tobacco Use   Smoking status: Never   Smokeless tobacco: Never  Substance and Sexual Activity   Alcohol use: Yes    Comment: socially   Drug use: No  Sexual activity: Not Currently    Birth control/protection: None    Comment: Single, not sexual active at this time  Other Topics Concern   Not on file  Social History Narrative   Not on file   Social Determinants of Health   Financial Resource Strain: Not on file  Food Insecurity: Not on file  Transportation Needs: Not on file  Physical Activity: Not on file  Stress: Not on file  Social Connections: Not on file    Family History  Problem Relation Age of Onset   Thyroid disease Sister    Breast cancer Maternal Aunt    Cancer Maternal Aunt        breast   Diabetes Maternal Grandmother    Hypertension Maternal Grandmother    Stroke Maternal Grandmother    Hypertension Other    Hypertension Mother    Sleep apnea Father    Obesity Father     Review of Systems Per hpid    Objective:   Vitals:   09/05/20 1507  BP:  124/86  Pulse: 90  Temp: 98.8 F (37.1 C)  SpO2: 99%   BP Readings from Last 3 Encounters:  09/05/20 124/86  11/26/19 132/88  11/02/19 119/67   Wt Readings from Last 3 Encounters:  09/05/20 259 lb (117.5 kg)  11/26/19 251 lb 1.6 oz (113.9 kg)  10/04/19 (!) 245 lb 3.2 oz (111.2 kg)   Body mass index is 45.88 kg/m.   Physical Exam Constitutional:      General: She is not in acute distress.    Appearance: Normal appearance. She is not ill-appearing.  HENT:     Head: Normocephalic and atraumatic.  Musculoskeletal:        General: Tenderness (mild tenderness right knee patellar-tibial tendon, no deformity, mild effusion) present.     Right lower leg: No edema.     Left lower leg: No edema.  Skin:    General: Skin is warm and dry.     Findings: No bruising.  Neurological:     Mental Status: She is alert and oriented to person, place, and time.     Comments: Grossly non-focal           Assessment & Plan:    See Problem List for Assessment and Plan of chronic medical problems.    This visit occurred during the SARS-CoV-2 public health emergency.  Safety protocols were in place, including screening questions prior to the visit, additional usage of staff PPE, and extensive cleaning of exam room while observing appropriate contact time as indicated for disinfecting solutions.

## 2020-09-05 ENCOUNTER — Ambulatory Visit: Payer: BC Managed Care – PPO | Admitting: Internal Medicine

## 2020-09-05 ENCOUNTER — Other Ambulatory Visit: Payer: Self-pay

## 2020-09-05 ENCOUNTER — Encounter: Payer: Self-pay | Admitting: Physician Assistant

## 2020-09-05 VITALS — BP 124/86 | HR 90 | Temp 98.8°F | Ht 63.0 in | Wt 259.0 lb

## 2020-09-05 DIAGNOSIS — G43809 Other migraine, not intractable, without status migrainosus: Secondary | ICD-10-CM

## 2020-09-05 DIAGNOSIS — M25561 Pain in right knee: Secondary | ICD-10-CM

## 2020-09-05 MED ORDER — NURTEC 75 MG PO TBDP: ORAL_TABLET | ORAL | 0 refills | Status: DC

## 2020-09-05 MED ORDER — UBRELVY 100 MG PO TABS: ORAL_TABLET | ORAL | 3 refills | Status: DC

## 2020-09-05 NOTE — Patient Instructions (Addendum)
     Medications changes include :   nurtec and ubrelvy      A referral was ordered for sports medicine.       Someone from their office will call you to schedule an appointment.

## 2020-09-06 DIAGNOSIS — M25561 Pain in right knee: Secondary | ICD-10-CM | POA: Insufficient documentation

## 2020-09-06 NOTE — Assessment & Plan Note (Signed)
Acute Started 1 week ago - no injury - occurred while walking dog Mild effusion ? Flare of OA Referred to sports med Advised ice, otc advil, topical arthritis meds

## 2020-09-06 NOTE — Assessment & Plan Note (Signed)
Chronic Worse in past month - typically one a month but they have been lasting a few days b/c no otc meds work imitrex and maxalt in past did not work well and she had side effects Trial of ubrelvy and nurtec - samples given She will let me know which one works and I will send in a refill -savings card given for each

## 2020-09-08 NOTE — Progress Notes (Deleted)
   Subjective:    I'm seeing this patient as a consultation for:  Dr. Quay Burow. Note will be routed back to referring provider/PCP.  CC: R knee pain  I, Anvitha Hutmacher, LAT, ATC, am serving as scribe for Dr. Lynne Leader.  HPI: Pt is a 45 y/o female presenting w/ c/o R knee pain x approximately 1-2 weeks that began while she was walking her dog.  She recalls feeling like something pulled in her knee while walking her dog but doesn't recall a specific MOI.  She locates her pain to .  R knee swelling: R knee mechanical symptoms: Aggravating factors: Treatments tried: Aspercreme; Biofreeze; heat; ice;   Past medical history, Surgical history, Family history, Social history, Allergies, and medications have been entered into the medical record, reviewed. ***  Review of Systems: No new headache, visual changes, nausea, vomiting, diarrhea, constipation, dizziness, abdominal pain, skin rash, fevers, chills, night sweats, weight loss, swollen lymph nodes, body aches, joint swelling, muscle aches, chest pain, shortness of breath, mood changes, visual or auditory hallucinations.   Objective:   There were no vitals filed for this visit. General: Well Developed, well nourished, and in no acute distress.  Neuro/Psych: Alert and oriented x3, extra-ocular muscles intact, able to move all 4 extremities, sensation grossly intact. Skin: Warm and dry, no rashes noted.  Respiratory: Not using accessory muscles, speaking in full sentences, trachea midline.  Cardiovascular: Pulses palpable, no extremity edema. Abdomen: Does not appear distended. MSK: ***  Lab and Radiology Results No results found for this or any previous visit (from the past 72 hour(s)). No results found.  Impression and Recommendations:    Assessment and Plan: 45 y.o. female with ***.  PDMP not reviewed this encounter. No orders of the defined types were placed in this encounter.  No orders of the defined types were placed in this  encounter.   Discussed warning signs or symptoms. Please see discharge instructions. Patient expresses understanding.   ***

## 2020-09-08 NOTE — Progress Notes (Signed)
Rito Ehrlich, am serving as a Education administrator for Dr. Lynne Leader.   Belinda Williams is a 45 y.o. female who presents to [place] at [facility] today for R knee pain.  She was last seen by Dr. Tamala Julian on 05/11/18 for f/u of L shoulder pain.  Today, pt reports R knee pain x 1-2 weeks that began while walking her dog.  She denies any specific MOI but reports feeling a pulling sensation in her R knee while walking her dog.  She locates her pain to Medial R knee and worse when squatting states the knee feels "Full" like there is fluid in there. States that the pain is getting better but not as good as she would like.   R knee swelling: she thinks it is R knee mechanical symptoms: no Aggravating factors: squatting, bending, extending all the way, getting into bed  Treatments tried: Aspercreme; Biofreeze; ice; heat;    Pertinent review of systems: No fevers or chills  Relevant historical information: Morbid obesity.  Insulin resistance.   Exam:  BP 130/82 (BP Location: Left Arm, Patient Position: Sitting, Cuff Size: Large)   Pulse 73   Ht 5\' 3"  (1.6 m)   Wt 259 lb (117.5 kg)   LMP 09/03/2020   SpO2 99%   BMI 45.88 kg/m  General: Well Developed, well nourished, and in no acute distress.   MSK: Right knee mild effusion normal motion  Not particularly tender to palpation. Stable ligamentous exam. Intact strength.    Lab and Radiology Results  Procedure: Real-time Ultrasound Guided Injection of right knee superior lateral patellar space Device: Philips Affiniti 50G Images permanently stored and available for review in PACS Ultrasound evaluation prior to injection reveals mild joint effusion.  Degenerative medial meniscus appearing. Verbal informed consent obtained.  Discussed risks and benefits of procedure. Warned about infection bleeding damage to structures skin hypopigmentation and fat atrophy among others. Patient expresses understanding and agreement Time-out  conducted.   Noted no overlying erythema, induration, or other signs of local infection.   Skin prepped in a sterile fashion.   Local anesthesia: Topical Ethyl chloride.   With sterile technique and under real time ultrasound guidance: 40 mg of Kenalog and 2 mL of Marcaine injected into knee joint. Fluid seen entering the joint capsule.   Completed without difficulty   Pain immediately resolved suggesting accurate placement of the medication.   Advised to call if fevers/chills, erythema, induration, drainage, or persistent bleeding.   Images permanently stored and available for review in the ultrasound unit.  Impression: Technically successful ultrasound guided injection.    X-ray images right knee obtained today personally and independently interpreted Mild DJD.  No acute fractures Await formal radiology review    Assessment and Plan: 45 y.o. female with right knee pain thought to be due to exacerbation of DJD or possible degenerative meniscus injury.  Plan for knee injection today quad strengthening with physical therapy and Voltaren gel.  Recheck in about 6 weeks.  Return sooner if needed.   PDMP not reviewed this encounter. Orders Placed This Encounter  Procedures   [place] LIMITED JOINT SPACE STRUCTURES LOW RIGHT(NO LINKED CHARGES)    Order Specific Question:   Reason for Exam (SYMPTOM  OR DIAGNOSIS REQUIRED)    Answer:   R knee pain    Order Specific Question:   Preferred imaging location?    Answer:   [place]   DG Knee AP/LAT W/[place] Right    Standing Status:  Future    Number of Occurrences:   1    Standing Expiration Date:   09/09/2021    Order Specific Question:   Reason for Exam (SYMPTOM  OR DIAGNOSIS REQUIRED)    Answer:   right knee pain    Order Specific Question:   Is patient pregnant?    Answer:   No    Order Specific Question:   Preferred imaging location?    Answer:   [facility]   Ambulatory referral to Physical Therapy     Referral Priority:   Routine    Referral Type:   Physical Medicine    Referral Reason:   Specialty Services Required    Requested Specialty:   Physical Therapy    Number of Visits Requested:   1   No orders of the defined types were placed in this encounter.    Discussed warning signs or symptoms. Please see discharge instructions. Patient expresses understanding.   The above documentation has been reviewed and is accurate and complete Lynne Leader, M.D.

## 2020-09-09 ENCOUNTER — Ambulatory Visit: Payer: Self-pay

## 2020-09-09 ENCOUNTER — Ambulatory Visit: Payer: BC Managed Care – PPO | Admitting: Family Medicine

## 2020-09-09 ENCOUNTER — Ambulatory Visit (INDEPENDENT_AMBULATORY_CARE_PROVIDER_SITE_OTHER): Payer: BC Managed Care – PPO

## 2020-09-09 ENCOUNTER — Other Ambulatory Visit: Payer: Self-pay

## 2020-09-09 ENCOUNTER — Encounter: Payer: Self-pay | Admitting: Family Medicine

## 2020-09-09 VITALS — BP 130/82 | HR 73 | Ht 63.0 in | Wt 259.0 lb

## 2020-09-09 DIAGNOSIS — M25561 Pain in right knee: Secondary | ICD-10-CM

## 2020-09-09 NOTE — Patient Instructions (Addendum)
Thank you for coming in today.   Please get an Xray today before you leave   I've referred you to Physical Therapy.  Let us know if you don't hear from them in one week.   Please use Voltaren gel (Generic Diclofenac Gel) up to 4x daily for pain as needed.  This is available over-the-counter as both the name brand Voltaren gel and the generic diclofenac gel.   Call or go to the ER if you develop a large red swollen joint with extreme pain or oozing puss.    Recheck in 6 weeks.   Let me know if you have a problem.

## 2020-09-10 NOTE — Progress Notes (Signed)
X-ray right knee shows small fluid in the knee joint.  No severe arthritis.  No fractures.

## 2020-09-23 ENCOUNTER — Ambulatory Visit: Payer: BC Managed Care – PPO | Admitting: Physical Therapy

## 2020-09-23 ENCOUNTER — Other Ambulatory Visit: Payer: Self-pay

## 2020-09-23 ENCOUNTER — Encounter: Payer: Self-pay | Admitting: Physical Therapy

## 2020-09-23 DIAGNOSIS — M25561 Pain in right knee: Secondary | ICD-10-CM | POA: Diagnosis not present

## 2020-09-23 DIAGNOSIS — R262 Difficulty in walking, not elsewhere classified: Secondary | ICD-10-CM

## 2020-09-23 DIAGNOSIS — M6281 Muscle weakness (generalized): Secondary | ICD-10-CM

## 2020-09-23 NOTE — Patient Instructions (Signed)
Access Code: [email or web address] URL: [email or web address] Date: 09/23/2020 Prepared by: Kearney Hard  Exercises Supine Bridge with [employer] Between Knees - 1 x daily - 7 x weekly - 2 sets - 10 reps Straight Leg Raise with External Rotation - 1 x daily - 7 x weekly - 2 sets - 10 reps Hooklying Isometric Clamshell - 1 x daily - 7 x weekly - 2 sets - 10 reps Squat with Chair Touch - 1 x daily - 7 x weekly - 2 sets - 10 reps Seated Hamstring Curl with Anchored Resistance - 1 x daily - 7 x weekly - 3 sets - 10 reps Standing Gastroc Stretch - 1 x daily - 7 x weekly - 3 reps - 30 seconds hold

## 2020-09-23 NOTE — Therapy (Addendum)
[facility] [address] [place], [place], [address] Phone: [phone]   Fax:  [phone]  Physical Therapy Evaluation/Discharge  Patient Details  Name: Belinda Williams MRN: [id number] Date of Birth: [birth year] Referring Provider (PT): Lynne Leader, MD   Encounter Date: 09/23/2020   PT End of Session - 09/23/20 1200     Visit Number 1    Number of Visits 2    Date for PT Re-Evaluation 10/24/20    PT Start Time 1779    PT Stop Time 1225    PT Time Calculation (min) 40 min    Activity Tolerance Patient tolerated treatment well    Behavior During Therapy [facility] for tasks assessed/performed             Past Medical History:  Diagnosis Date   ALLERGIC RHINITIS    Anemia    Asthma    as a child - no problem as adult   Headache(784.0)    Heart murmur    as a child - no problem as adult - closed per pt   Lactose intolerance    Lower back pain    MIGRAINE, MENSTRUAL    Morbid obesity ([place])    PAP SMEAR, ABNORMAL 08/23/2006   Thyroglossal duct cyst    Excision June 2013 by ENT (bates)   Vitamin B 12 deficiency    Vitamin D deficiency    Weight gain     Past Surgical History:  Procedure Laterality Date   DERMOID CYST  EXCISION  08/2011   Neck   DILATATION & CURRETTAGE/HYSTEROSCOPY WITH RESECTOCOPE  02/2012   thyroductal cyst     vaginal cyst removed      There were no vitals filed for this visit.   Subjective Assessment - 09/23/20 1154     Subjective Pt arriving to [facility] for evaluation of right knee pain which began about 1 month ago. Pt stated she saw Dr. Georgina Snell on 09/09/2020. Pt stated she received an injection and she currently doesn't have any pain. Pt stating she is back on her TM.    Pertinent History morbid obesity, anemia, asthma, headaches, vit d def, bit B12 def, h/o left shoulder pain    Diagnostic tests X-ray: mild DJD    Patient Stated Goals Home exercises with pain    Currently in Pain? No/denies                 [facility] PT Assessment - 09/23/20 0001       Assessment   Medical Diagnosis M25.561 acute pain right knee    Referring Provider (PT) Lynne Leader, MD    Hand Dominance Right    Prior Therapy no      Precautions   Precautions None      Restrictions   Weight Bearing Restrictions No      Balance Screen   Has the patient fallen in the past 6 months No    Is the patient reluctant to leave their home because of a fear of falling?  No      Home Ecologist residence    Living Arrangements Spouse/significant other      Prior Function   Level of Independence Independent    Vocation Full time employment      Cognition   Overall Cognitive Status Within Functional Limits for tasks assessed      Observation/Other Assessments   Focus on Therapeutic Outcomes (FOTO)  99 (predicted 93)      ROM /  Strength   AROM / PROM / Strength AROM;Strength      AROM   AROM Assessment Site Knee    Right/Left Knee Right;Left    Right Knee Extension 0    Right Knee Flexion 122    Left Knee Extension 0    Left Knee Flexion 125      Strength   Strength Assessment Site Knee    Right/Left Knee Right;Left    Right Knee Flexion 4+/5    Right Knee Extension 5/5    Left Knee Flexion 5/5    Left Knee Extension 5/5      Palpation   Patella mobility WFL    Palpation comment mild tenderness on medial right knee      Special Tests   Other special tests Negative medial/lateral drawer test on the right knee      Transfers   Five time sit to stand comments  10 seconds      Ambulation/Gait   Gait Pattern Step-through pattern;Wide base of support                           [facility] Adult PT Treatment/Exercise - 09/23/20 0001       Exercises   Exercises Knee/Hip      Knee/Hip Exercises: Stretches   Active Hamstring Stretch 2 reps;Right;20 seconds    Gastroc Stretch 2 reps;20 seconds;Right      Knee/Hip Exercises: Seated   Hamstring Curl  Strengthening;Right;10 reps    Hamstring Limitations green theraband    Sit to [employer] 5 reps      Knee/Hip Exercises: Supine   Bridges Strengthening;10 reps    Bridges Limitations with ball squeeze    Straight Leg Raise with External Rotation Strengthening;Right;10 reps                    PT Education - 09/23/20 1159     Education Details PT POC, HEP    Person(s) Educated Patient    Methods Explanation;Demonstration;Handout;Tactile cues;Verbal cues    Comprehension Verbalized understanding;Returned demonstration              PT Short Term Goals - 09/23/20 1253       PT SHORT TERM GOAL #1   Title Pt will be independent in her initial HEP.    Time 2    Period Weeks    Status New    Target Date 10/10/20      PT SHORT TERM GOAL #2   Title Pt will be able to report no pain after two weeks of HEP in her right knee with ADL and recreational activities.    Time 2    Period Weeks    Status New                      Plan - 09/23/20 1254     Clinical Impression Statement Pt presenting today for evaluation of right knee pain which began about 1 month ago. 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 Pt presenting today with normal ROM in bilateral knee and mild decrease in right knee flexion compared to left. Pt was issued a HEP. Pt to follow up in 2 weeks with a skilled PT for HEP update/progression and then discharge.    Personal Factors and Comorbidities Comorbidity 3+    Comorbidities morbid obesity, anemia, asthma, headaches, vit d def, bit B12 def, h/o left shoulder pain    Examination-Activity Limitations Other;Stairs  Stability/Clinical Decision Making Stable/Uncomplicated    Clinical Decision Making Low    Rehab Potential Excellent    PT Frequency --   1 additional visit   PT Duration 2 weeks    PT Treatment/Interventions Balance training;Therapeutic exercise;Therapeutic activities;Functional mobility training;Stair training;Gait  training;Neuromuscular re-education;Passive range of motion;Cryotherapy;Electrical Stimulation;Iontophoresis 79m/ml Dexamethasone;Manual techniques    PT Next Visit Plan update pt's HEP, discharge    PT Home Exercise Plan Access Code: [email or web address] URL: [email or web address]  Date: 09/23/2020  Prepared by: JKearney Hard   Exercises  Supine Bridge with Mini Swiss Ball Between Knees - 1 x daily - 7 x weekly - 2 sets - 10 reps  Straight Leg Raise with External Rotation - 1 x daily - 7 x weekly - 2 sets - 10 reps  Hooklying Isometric Clamshell - 1 x daily - 7 x weekly - 2 sets - 10 reps  Squat with Chair Touch - 1 x daily - 7 x weekly - 2 sets - 10 reps  Seated Hamstring Curl with Anchored Resistance - 1 x daily - 7 x weekly - 3 sets - 10 reps  Standing Gastroc Stretch - 1 x daily - 7 x weekly - 3 reps - 30 seconds hold    Consulted and Agree with Plan of Care Patient             Patient will benefit from skilled therapeutic intervention in order to improve the following deficits and impairments:  Pain, Decreased strength, Difficulty walking, Impaired flexibility  Visit Diagnosis: Acute pain of right knee - Plan: PT plan of care cert/re-cert  Difficulty in walking, not elsewhere classified - Plan: PT plan of care cert/re-cert  Muscle weakness (generalized) - Plan: PT plan of care cert/re-cert     Problem List Patient Active Problem List   Diagnosis Date Noted   Acute pain of right knee 09/06/2020   Iron deficiency anemia 10/04/2019   Heavy menstrual bleeding 10/04/2019   Subacromial bursitis of left shoulder joint 04/13/2018   Left shoulder pain 03/20/2018   Class 3 severe obesity with serious comorbidity and body mass index (BMI) of 40.0 to 44.9 in adult ([place] 01/10/2018   Insulin resistance 01/10/2018   Anemia 09/08/2017   Family history of diabetes mellitus in grandmother 09/08/2017   Fatigue 08/04/2015   B12 deficiency 08/04/2015   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 Patient goals were not met. Patient is being discharged due to not returning since the last visit.  MScot Jun PT, DPT, OCS, ATC 12/19/20  1:28 PM     [facility] [address] [address]Phone: [phone]  Fax:  [phone] Name: RRehema MuffleyMRN: [id number] of Birth: [birth year]

## 2020-10-14 ENCOUNTER — Encounter: Payer: BC Managed Care – PPO | Admitting: Physical Therapy

## 2020-10-21 ENCOUNTER — Ambulatory Visit: Payer: BC Managed Care – PPO | Admitting: Family Medicine

## 2021-01-25 NOTE — Progress Notes (Signed)
Subjective:    Patient ID: Belinda Williams, female    DOB: 01/06/1976, 45 y.o.   MRN: [id number]   This visit occurred during the SARS-CoV-2 public health emergency.  Safety protocols were in place, including screening questions prior to the visit, additional usage of staff PPE, and extensive cleaning of exam room while observing appropriate contact time as indicated for disinfecting solutions.    HPI She is here for a physical exam.   She has a slight cough - had it for about 10 days now.  She has some mild cold symptoms.  She thinks the cough is getting a little bit better, but at times it seems like it is worse.  The cough is dry.  Medications and allergies reviewed with patient and updated if appropriate.  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       Iron-Vitamin C (IRON 100/C) 100-250 MG TABS      No current facility-administered medications on file prior to visit.    Past Medical History:  Diagnosis Date   ALLERGIC RHINITIS    Anemia    Asthma    as a child - no problem as adult   Headache(784.0)    Heart murmur    as a child - no problem as adult - closed per pt   Lactose intolerance    Lower back pain    MIGRAINE, MENSTRUAL     Morbid obesity ([place])    PAP SMEAR, ABNORMAL 08/23/2006   Thyroglossal duct cyst    Excision June 2013 by ENT (bates)   Vitamin B 12 deficiency    Vitamin D deficiency    Weight gain     Past Surgical History:  Procedure Laterality Date   DERMOID CYST  EXCISION  08/2011   Neck   DILATATION & CURRETTAGE/HYSTEROSCOPY WITH RESECTOCOPE  02/2012   thyroductal cyst     vaginal cyst removed      Social History   Socioeconomic History   Marital status: Single    Spouse name: Not on file   Number of children: Not on file   Years of education: Not on file   Highest education level: Not on file  Occupational History   Occupation: Mudlogger of social activities   Tobacco Use   Smoking status: Never   Smokeless tobacco: Never  Substance and Sexual Activity   Alcohol use: Yes    Comment: socially   Drug use: No   Sexual activity: Not Currently    Birth control/protection: None    Comment: Single, not sexual active at this time  Other Topics Concern   Not on file  Social History Narrative   Not on file   Social Determinants of Health   Financial Resource Strain: Not on file  Food Insecurity: Not on file  Transportation Needs: Not on file  Physical Activity: Not on file  Stress: Not on file  Social Connections: Not on file    Family History  Problem Relation Age of Onset   Thyroid disease Sister    Breast cancer Maternal Aunt    Cancer Maternal Aunt        breast   Diabetes Maternal Grandmother    Hypertension Maternal Grandmother    Stroke Maternal Grandmother    Hypertension Other    Hypertension Mother    Sleep apnea Father    Obesity Father     Review of Systems  Constitutional:  Negative for fever.  HENT:  Positive for sinus pressure (mild). Negative for congestion, ear pain, postnasal drip, rhinorrhea, sinus pain and sore throat.   Eyes:  Negative for visual disturbance.  Respiratory:  Positive for cough (with uri). Negative for shortness of breath and  wheezing.   Cardiovascular:  Negative for chest pain, palpitations and leg swelling.  Gastrointestinal:  Negative for abdominal pain, blood in stool, constipation, diarrhea and nausea.       No gerd  Genitourinary:  Negative for dysuria and hematuria.  Musculoskeletal:  Negative for arthralgias and back pain.  Skin:  Negative for color change.  Neurological:  Negative for dizziness, light-headedness and headaches.  Psychiatric/Behavioral:  Negative for dysphoric mood. The patient is not nervous/anxious.       Objective:   Vitals:   01/27/21 1447  BP: 132/84  Pulse: 94  Temp: 98.2 F (36.8 C)  SpO2: 99%   Filed Weights   01/27/21 1447  Weight: 256 lb (116.1 kg)   Body mass index is 45.35 kg/m.  BP Readings from Last 3 Encounters:  01/27/21 132/84  09/09/20 130/82  09/05/20 124/86    Wt Readings from Last 3 Encounters:  01/27/21 256 lb (116.1 kg)  09/09/20 259 lb (117.5 kg)  09/05/20 259 lb (117.5 kg)     Physical Exam Constitutional: She appears well-developed and well-nourished. No distress.  HENT:  Head: Normocephalic and atraumatic.  Right Ear: External ear normal. Normal ear canal and TM Left Ear: External ear normal.  Normal ear canal and TM Mouth/Throat: Oropharynx is clear and moist.  Eyes: Conjunctivae and EOM are normal.  Neck: Neck supple. No tracheal deviation present. No thyromegaly present.  No carotid bruit  Cardiovascular: Normal rate, regular rhythm and normal heart sounds.   No murmur heard.  No edema. Pulmonary/Chest: Effort normal and breath sounds normal. No respiratory distress. She has no wheezes. She has no rales.  Breast: deferred   Abdominal: Soft. She exhibits no distension. There is no tenderness.  Lymphadenopathy: She has no cervical adenopathy.  Skin: Skin is warm and dry. She is not diaphoretic.  Psychiatric: She has a normal mood and affect. Her behavior is normal.     Lab Results  Component Value Date   WBC 4.2 11/26/2019    HGB 12.1 11/26/2019   HCT 38.7 11/26/2019   PLT 287 11/26/2019   GLUCOSE 85 10/04/2019   CHOL 166 09/21/2019   TRIG 75 09/21/2019   HDL 37 (L) 09/21/2019   LDLCALC 113 (H) 09/21/2019   ALT 8 10/04/2019   AST 11 (L) 10/04/2019   NA 138 10/04/2019   K 4.1 10/04/2019   CL 107 10/04/2019   CREATININE 0.81 10/04/2019   BUN 11 10/04/2019   CO2 21 (L) 10/04/2019   TSH 2.46 09/21/2019   HGBA1C 5.2 09/21/2019  Assessment & Plan:   Physical exam: Screening blood work  ordered Exercise  30 min of treadmill 3 days a week Weight  advised weight loss Substance abuse  none   Reviewed recommended immunizations.   Health Maintenance  Topic Date Due   PAP SMEAR-Modifier  05/31/2020   COLONOSCOPY (Pts 45-72yrs Insurance coverage will need to be confirmed)  Never done   COVID-19 Vaccine (3 - Booster for Moderna series) 02/12/2021 (Originally 04/18/2020)   INFLUENZA VACCINE  06/12/2021 (Originally 10/13/2020)   MAMMOGRAM  09/05/2021 (Originally 12/26/1993)   TETANUS/TDAP  09/10/2027   Hepatitis C Screening  Completed   HIV Screening  Completed   HPV VACCINES  Aged Out   Pneumococcal Vaccine 63-11 Years old  Discontinued          See Problem List for Assessment and Plan of chronic medical problems.

## 2021-01-25 NOTE — Patient Instructions (Addendum)
Blood work was ordered.     Medications changes include : none      Please followup in 1 year   Health Maintenance, Female Adopting a healthy lifestyle and getting preventive care are important in promoting health and wellness. Ask your health care provider about: The right schedule for you to have regular tests and exams. Things you can do on your own to prevent diseases and keep yourself healthy. What should I know about diet, weight, and exercise? Eat a healthy diet  Eat a diet that includes plenty of vegetables, fruits, low-fat dairy products, and lean protein. Do not eat a lot of foods that are high in solid fats, added sugars, or sodium. Maintain a healthy weight Body mass index (BMI) is used to identify weight problems. It estimates body fat based on height and weight. Your health care provider can help determine your BMI and help you achieve or maintain a healthy weight. Get regular exercise Get regular exercise. This is one of the most important things you can do for your health. Most adults should: Exercise for at least 150 minutes each week. The exercise should increase your heart rate and make you sweat (moderate-intensity exercise). Do strengthening exercises at least twice a week. This is in addition to the moderate-intensity exercise. Spend less time sitting. Even light physical activity can be beneficial. Watch cholesterol and blood lipids Have your blood tested for lipids and cholesterol at 45 years of age, then have this test every 5 years. Have your cholesterol levels checked more often if: Your lipid or cholesterol levels are high. You are older than 45 years of age. You are at high risk for heart disease. What should I know about cancer screening? Depending on your health history and family history, you may need to have cancer screening at various ages. This may include screening for: Breast cancer. Cervical cancer. Colorectal cancer. Skin cancer. Lung  cancer. What should I know about heart disease, diabetes, and high blood pressure? Blood pressure and heart disease High blood pressure causes heart disease and increases the risk of stroke. This is more likely to develop in people who have high blood pressure readings or are overweight. Have your blood pressure checked: Every 3-5 years if you are 58-27 years of age. Every year if you are 41 years old or older. Diabetes Have regular diabetes screenings. This checks your fasting blood sugar level. Have the screening done: Once every three years after age 24 if you are at a normal weight and have a low risk for diabetes. More often and at a younger age if you are overweight or have a high risk for diabetes. What should I know about preventing infection? Hepatitis B If you have a higher risk for hepatitis B, you should be screened for this virus. Talk with your health care provider to find out if you are at risk for hepatitis B infection. Hepatitis C Testing is recommended for: Everyone born from [birth year] through 1965. Anyone with known risk factors for hepatitis C. Sexually transmitted infections (STIs) Get screened for STIs, including gonorrhea and chlamydia, if: You are sexually active and are younger than 45 years of age. You are older than 45 years of age and your health care provider tells you that you are at risk for this type of infection. Your sexual activity has changed since you were last screened, and you are at increased risk for chlamydia or gonorrhea. Ask your health care provider if you are at risk. Ask your  health care provider about whether you are at high risk for HIV. Your health care provider may recommend a prescription medicine to help prevent HIV infection. If you choose to take medicine to prevent HIV, you should first get tested for HIV. You should then be tested every 3 months for as long as you are taking the medicine. Pregnancy If you are about to stop having your  period (premenopausal) and you may become pregnant, seek counseling before you get pregnant. Take 400 to 800 micrograms (mcg) of folic acid every day if you become pregnant. Ask for birth control (contraception) if you want to prevent pregnancy. Osteoporosis and menopause Osteoporosis is a disease in which the bones lose minerals and strength with aging. This can result in bone fractures. If you are 59 years old or older, or if you are at risk for osteoporosis and fractures, ask your health care provider if you should: Be screened for bone loss. Take a calcium or vitamin D supplement to lower your risk of fractures. Be given hormone replacement therapy (HRT) to treat symptoms of menopause. Follow these instructions at home: Alcohol use Do not drink alcohol if: Your health care provider tells you not to drink. You are pregnant, may be pregnant, or are planning to become pregnant. If you drink alcohol: Limit how much you have to: 0-1 drink a day. Know how much alcohol is in your drink. In the [place] one drink equals one 12 oz bottle of beer (355 mL), one 5 oz glass of wine (148 mL), or one 1 oz glass of hard liquor (44 mL). Lifestyle Do not use any products that contain nicotine or tobacco. These products include cigarettes, chewing tobacco, and vaping devices, such as e-cigarettes. If you need help quitting, ask your health care provider. Do not use street drugs. Do not share needles. Ask your health care provider for help if you need support or information about quitting drugs. General instructions Schedule regular health, dental, and eye exams. Stay current with your vaccines. Tell your health care provider if: You often feel depressed. You have ever been abused or do not feel safe at home. Summary Adopting a healthy lifestyle and getting preventive care are important in promoting health and wellness. Follow your health care provider's instructions about healthy diet, exercising, and  getting tested or screened for diseases. Follow your health care provider's instructions on monitoring your cholesterol and blood pressure. This information is not intended to replace advice given to you by your health care provider. Make sure you discuss any questions you have with your health care provider. Document Revised: 07/21/2020 Document Reviewed: 07/21/2020 Elsevier Patient Education  [employer].

## 2021-01-27 ENCOUNTER — Other Ambulatory Visit: Payer: Self-pay

## 2021-01-27 ENCOUNTER — Ambulatory Visit (INDEPENDENT_AMBULATORY_CARE_PROVIDER_SITE_OTHER): Payer: BC Managed Care – PPO | Admitting: Internal Medicine

## 2021-01-27 ENCOUNTER — Encounter: Payer: Self-pay | Admitting: Internal Medicine

## 2021-01-27 VITALS — BP 132/84 | HR 94 | Temp 98.2°F | Ht 63.0 in | Wt 256.0 lb

## 2021-01-27 DIAGNOSIS — Z833 Family history of diabetes mellitus: Secondary | ICD-10-CM | POA: Diagnosis not present

## 2021-01-27 DIAGNOSIS — D509 Iron deficiency anemia, unspecified: Secondary | ICD-10-CM

## 2021-01-27 DIAGNOSIS — E042 Nontoxic multinodular goiter: Secondary | ICD-10-CM | POA: Diagnosis not present

## 2021-01-27 DIAGNOSIS — G43809 Other migraine, not intractable, without status migrainosus: Secondary | ICD-10-CM | POA: Diagnosis not present

## 2021-01-27 DIAGNOSIS — Z0000 Encounter for general adult medical examination without abnormal findings: Secondary | ICD-10-CM

## 2021-01-27 DIAGNOSIS — E538 Deficiency of other specified B group vitamins: Secondary | ICD-10-CM | POA: Diagnosis not present

## 2021-01-27 DIAGNOSIS — J4520 Mild intermittent asthma, uncomplicated: Secondary | ICD-10-CM | POA: Diagnosis not present

## 2021-01-27 LAB — CBC WITH DIFFERENTIAL/PLATELET
Basophils Absolute: 0.1 10*3/uL (ref 0.0–0.1)
Basophils Relative: 1.2 % (ref 0.0–3.0)
Eosinophils Absolute: 0.4 10*3/uL (ref 0.0–0.7)
Eosinophils Relative: 6.4 % — ABNORMAL HIGH (ref 0.0–5.0)
HCT: 32.9 % — ABNORMAL LOW (ref 36.0–46.0)
Hemoglobin: 10.2 g/dL — ABNORMAL LOW (ref 12.0–15.0)
Lymphocytes Relative: 30.2 % (ref 12.0–46.0)
Lymphs Abs: 1.7 10*3/uL (ref 0.7–4.0)
MCHC: 31 g/dL (ref 30.0–36.0)
MCV: 72.9 fl — ABNORMAL LOW (ref 78.0–100.0)
Monocytes Absolute: 0.4 10*3/uL (ref 0.1–1.0)
Monocytes Relative: 6.4 % (ref 3.0–12.0)
Neutro Abs: 3.1 10*3/uL (ref 1.4–7.7)
Neutrophils Relative %: 55.8 % (ref 43.0–77.0)
Platelets: 362 10*3/uL (ref 150.0–400.0)
RBC: 4.52 Mil/uL (ref 3.87–5.11)
RDW: 20.3 % — ABNORMAL HIGH (ref 11.5–15.5)
WBC: 5.5 10*3/uL (ref 4.0–10.5)

## 2021-01-27 LAB — COMPREHENSIVE METABOLIC PANEL
ALT: 11 U/L (ref 0–35)
AST: 11 U/L (ref 0–37)
Albumin: 3.6 g/dL (ref 3.5–5.2)
Alkaline Phosphatase: 81 U/L (ref 39–117)
BUN: 11 mg/dL (ref 6–23)
CO2: 29 mEq/L (ref 19–32)
Calcium: 8.8 mg/dL (ref 8.4–10.5)
Chloride: 105 mEq/L (ref 96–112)
Creatinine, Ser: 0.77 mg/dL (ref 0.40–1.20)
GFR: 93.38 mL/min (ref >60.00)
Glucose, Bld: 79 mg/dL (ref 70–99)
Potassium: 3.9 mEq/L (ref 3.5–5.1)
Sodium: 139 mEq/L (ref 135–145)
Total Bilirubin: 0.3 mg/dL (ref 0.2–1.2)
Total Protein: 7 g/dL (ref 6.0–8.3)

## 2021-01-27 LAB — LIPID PANEL
Cholesterol: 151 mg/dL (ref 0–200)
HDL: 38.9 mg/dL — ABNORMAL LOW
LDL Cholesterol: 99 mg/dL (ref 0–99)
NonHDL: 112.38
Total CHOL/HDL Ratio: 4
Triglycerides: 65 mg/dL (ref 0.0–149.0)
VLDL: 13 mg/dL (ref 0.0–40.0)

## 2021-01-27 LAB — HEMOGLOBIN A1C: Hgb A1c MFr Bld: 5.4 % (ref 4.6–6.5)

## 2021-01-27 LAB — VITAMIN B12: Vitamin B-12: 683 pg/mL (ref 211–911)

## 2021-01-27 LAB — TSH: TSH: 1.52 u[IU]/mL (ref 0.35–5.50)

## 2021-01-27 NOTE — Assessment & Plan Note (Signed)
History of asthma as a child Has not had any issues with asthma for years

## 2021-01-27 NOTE — Assessment & Plan Note (Signed)
Chronic Encourage weight loss She is exercising regularly-encouraged her to increase if possible and to work on weight loss

## 2021-01-27 NOTE — Assessment & Plan Note (Addendum)
Chronic Not currently taking B12 Check B12 level

## 2021-01-27 NOTE — Assessment & Plan Note (Addendum)
History of iron deficiency anemia Taking iron Having heavy periods CBC, iron panel

## 2021-01-27 NOTE — Assessment & Plan Note (Signed)
Chronic Notes change per patient Stable by imaging and biopsy years ago negative TSH

## 2021-01-27 NOTE — Assessment & Plan Note (Signed)
Check a1c 

## 2021-01-27 NOTE — Assessment & Plan Note (Addendum)
Chronic Infrequent migraines Takes bc powder prn-has not tolerated other medications so this is likely the best option and she does not take it often so we will continue to use as needed

## 2021-01-28 LAB — IRON,TIBC AND FERRITIN PANEL
%SAT: 6 % (calc) — ABNORMAL LOW (ref 16–45)
Ferritin: 11 ng/mL — ABNORMAL LOW (ref 16–232)
Iron: 19 ug/dL — ABNORMAL LOW (ref 40–190)
TIBC: 340 mcg/dL (calc) (ref 250–450)

## 2022-01-28 ENCOUNTER — Encounter: Payer: Self-pay | Admitting: Internal Medicine

## 2022-01-28 NOTE — Patient Instructions (Addendum)

## 2022-01-28 NOTE — Progress Notes (Unsigned)
Subjective:    Patient ID: Belinda Williams, female    DOB: 02-04-1976, 46 y.o.   MRN: [id number]      HPI Belinda Williams is here for a Physical exam.    Belinda Williams is eating less, exercising,  not losing weight-this is very frustrating  Mom-overactive thyroid, sister -  underactive.  Belinda Williams has a goiter-denies changes in the size  Medications and allergies reviewed with patient and updated if appropriate.  Current Outpatient Medications on File Prior to Visit  Medication Sig Dispense Refill   Aspirin-Acetaminophen 500-325 MG PACK Take by mouth as needed.       Iron-Vitamin C (IRON 100/C) 100-250 MG TABS      tranexamic acid (LYSTEDA) 650 MG TABS tablet Take 1,300 mg by mouth 3 (three) times daily.     No current facility-administered medications on file prior to visit.    Review of Systems  Constitutional:  Negative for fever.  Eyes:  Negative for visual disturbance.  Respiratory:  Negative for cough, shortness of breath and wheezing.   Cardiovascular:  Negative for chest pain, palpitations and leg swelling.  Gastrointestinal:  Negative for abdominal pain, blood in stool, constipation, diarrhea and nausea.  Genitourinary:  Negative for dysuria.  Musculoskeletal:  Positive for arthralgias (occ knee pain). Negative for back pain.  Skin:  Negative for rash.  Neurological:  Positive for headaches (with menses). Negative for light-headedness.  Psychiatric/Behavioral:  Negative for dysphoric mood. The patient is not nervous/anxious.        Objective:   Vitals:   01/29/22 1521  BP: 122/76  Pulse: 96  Temp: 98.9 F (37.2 C)  SpO2: 99%   Filed Weights   01/29/22 1521  Weight: 250 lb (113.4 kg)   Body mass index is 44.29 kg/m.  BP Readings from Last 3 Encounters:  01/29/22 122/76  01/27/21 132/84  09/09/20 130/82    Wt Readings from Last 3 Encounters:  01/29/22 250 lb (113.4 kg)  01/27/21 256 lb (116.1 kg)  09/09/20 259 lb (117.5 kg)       Physical  Exam Constitutional: Belinda Williams appears well-developed and well-nourished. No distress.  HENT:  Head: Normocephalic and atraumatic.  Right Ear: External ear normal. Normal ear canal and TM Left Ear: External ear normal.  Normal ear canal and TM Mouth/Throat: Oropharynx is clear and moist.  Eyes: Conjunctivae normal.  Neck: Neck supple. No tracheal deviation present. No thyromegaly present.  No carotid bruit  Cardiovascular: Normal rate, regular rhythm and normal heart sounds.   No murmur heard.  No edema. Pulmonary/Chest: Effort normal and breath sounds normal. No respiratory distress. Belinda Williams has no wheezes. Belinda Williams has no rales.  Breast: deferred   Abdominal: Soft. Belinda Williams exhibits no distension. There is no tenderness.  Lymphadenopathy: Belinda Williams has no cervical adenopathy.  Skin: Skin is warm and dry. Belinda Williams is not diaphoretic.  Psychiatric: Belinda Williams has a normal mood and affect. Her behavior is normal.     Lab Results  Component Value Date   WBC 5.5 01/27/2021   HGB 10.2 (L) 01/27/2021   HCT 32.9 (L) 01/27/2021   PLT 362.0 01/27/2021   GLUCOSE 79 01/27/2021   CHOL 151 01/27/2021   TRIG 65.0 01/27/2021   HDL 38.90 (L) 01/27/2021   LDLCALC 99 01/27/2021   ALT 11 01/27/2021   AST 11 01/27/2021   NA 139 01/27/2021   K 3.9 01/27/2021   CL 105 01/27/2021   CREATININE 0.77 01/27/2021   BUN 11 01/27/2021   CO2 29 01/27/2021  TSH 1.52 01/27/2021   HGBA1C 5.4 01/27/2021         Assessment & Plan:   Physical exam: Screening blood work  ordered Exercise  regular  Weight wants to lose weight-discussed at length Substance abuse  none   Reviewed recommended immunizations.   Health Maintenance  Topic Date Due   MAMMOGRAM  Never done   COVID-19 Vaccine (3 - Moderna series) 04/18/2020   PAP SMEAR-Modifier  05/31/2020   COLONOSCOPY (Pts 45-75yr Insurance coverage will need to be confirmed)  Never done   INFLUENZA VACCINE  Completed   Hepatitis C Screening  Completed   HIV Screening  Completed    HPV VACCINES  Aged Out          See Problem List for Assessment and Plan of chronic medical problems.

## 2022-01-29 ENCOUNTER — Encounter: Payer: Self-pay | Admitting: Internal Medicine

## 2022-01-29 ENCOUNTER — Ambulatory Visit (INDEPENDENT_AMBULATORY_CARE_PROVIDER_SITE_OTHER): Payer: BC Managed Care – PPO | Admitting: Internal Medicine

## 2022-01-29 VITALS — BP 122/76 | HR 96 | Temp 98.9°F | Ht 63.0 in | Wt 250.0 lb

## 2022-01-29 DIAGNOSIS — Z833 Family history of diabetes mellitus: Secondary | ICD-10-CM

## 2022-01-29 DIAGNOSIS — Z0000 Encounter for general adult medical examination without abnormal findings: Secondary | ICD-10-CM | POA: Diagnosis not present

## 2022-01-29 DIAGNOSIS — E538 Deficiency of other specified B group vitamins: Secondary | ICD-10-CM | POA: Diagnosis not present

## 2022-01-29 DIAGNOSIS — Z1211 Encounter for screening for malignant neoplasm of colon: Secondary | ICD-10-CM

## 2022-01-29 DIAGNOSIS — Z23 Encounter for immunization: Secondary | ICD-10-CM | POA: Diagnosis not present

## 2022-01-29 DIAGNOSIS — D509 Iron deficiency anemia, unspecified: Secondary | ICD-10-CM

## 2022-01-29 LAB — IBC PANEL
Iron: 12 ug/dL — ABNORMAL LOW (ref 42–145)
Saturation Ratios: 3 % — ABNORMAL LOW (ref 20.0–50.0)
TIBC: 406 ug/dL (ref 250.0–450.0)
Transferrin: 290 mg/dL (ref 212.0–360.0)

## 2022-01-29 LAB — CBC WITH DIFFERENTIAL/PLATELET
Basophils Absolute: 0 10*3/uL (ref 0.0–0.1)
Basophils Relative: 0.6 % (ref 0.0–3.0)
Eosinophils Absolute: 0.3 10*3/uL (ref 0.0–0.7)
Eosinophils Relative: 4 % (ref 0.0–5.0)
HCT: 28.2 % — ABNORMAL LOW (ref 36.0–46.0)
Hemoglobin: 8.7 g/dL — ABNORMAL LOW (ref 12.0–15.0)
Lymphocytes Relative: 29.5 % (ref 12.0–46.0)
Lymphs Abs: 1.9 10*3/uL (ref 0.7–4.0)
MCHC: 30.7 g/dL (ref 30.0–36.0)
MCV: 60.1 fl — ABNORMAL LOW (ref 78.0–100.0)
Monocytes Absolute: 0.4 10*3/uL (ref 0.1–1.0)
Monocytes Relative: 6.2 % (ref 3.0–12.0)
Neutro Abs: 3.8 10*3/uL (ref 1.4–7.7)
Neutrophils Relative %: 59.7 % (ref 43.0–77.0)
Platelets: 410 10*3/uL — ABNORMAL HIGH (ref 150.0–400.0)
RBC: 4.7 Mil/uL (ref 3.87–5.11)
RDW: 23.4 % — ABNORMAL HIGH (ref 11.5–15.5)
WBC: 6.3 10*3/uL (ref 4.0–10.5)

## 2022-01-29 LAB — VITAMIN B12: Vitamin B-12: 423 pg/mL (ref 211–911)

## 2022-01-29 LAB — LIPID PANEL
Cholesterol: 150 mg/dL (ref 0–200)
HDL: 36.9 mg/dL — ABNORMAL LOW (ref >39.00)
LDL Cholesterol: 102 mg/dL — ABNORMAL HIGH (ref 0–99)
NonHDL: 112.73
Total CHOL/HDL Ratio: 4
Triglycerides: 54 mg/dL (ref 0.0–149.0)
VLDL: 10.8 mg/dL (ref 0.0–40.0)

## 2022-01-29 LAB — COMPREHENSIVE METABOLIC PANEL
ALT: 7 U/L (ref 0–35)
AST: 8 U/L (ref 0–37)
Albumin: 3.8 g/dL (ref 3.5–5.2)
Alkaline Phosphatase: 80 U/L (ref 39–117)
BUN: 12 mg/dL (ref 6–23)
CO2: 25 mEq/L (ref 19–32)
Calcium: 9.1 mg/dL (ref 8.4–10.5)
Chloride: 107 mEq/L (ref 96–112)
Creatinine, Ser: 0.83 mg/dL (ref 0.40–1.20)
GFR: 84.74 mL/min (ref >60.00)
Glucose, Bld: 86 mg/dL (ref 70–99)
Potassium: 4.1 mEq/L (ref 3.5–5.1)
Sodium: 138 mEq/L (ref 135–145)
Total Bilirubin: 0.3 mg/dL (ref 0.2–1.2)
Total Protein: 7.6 g/dL (ref 6.0–8.3)

## 2022-01-29 LAB — FERRITIN: Ferritin: 7.9 ng/mL — ABNORMAL LOW (ref 10.0–291.0)

## 2022-01-29 LAB — TSH: TSH: 2.3 u[IU]/mL (ref 0.35–5.50)

## 2022-01-29 LAB — HEMOGLOBIN A1C: Hgb A1c MFr Bld: 5.9 % (ref 4.6–6.5)

## 2022-01-29 NOTE — Assessment & Plan Note (Signed)
Chronic ?Check B12 level ?

## 2022-01-29 NOTE — Assessment & Plan Note (Signed)
At risk of developing diabetes Check a1c Low sugar / carb diet Stressed regular exercise

## 2022-01-29 NOTE — Assessment & Plan Note (Signed)
History of iron deficiency anemia Check CBC, iron levels

## 2022-01-30 DIAGNOSIS — R7303 Prediabetes: Secondary | ICD-10-CM | POA: Insufficient documentation

## 2022-01-30 NOTE — Assessment & Plan Note (Signed)
Chronic BMI today 44.29 She has been trying to lose weight unsuccessfully Stressed regular exercise Stressed the importance of eating a healthy diet high in protein, fiber, vegetables, fruits, but stressed portion size and calorie intake is the biggest reason why she is likely not losing weight She wants to try to do it naturally-discussed nutrition, medications that may help

## 2022-02-07 ENCOUNTER — Encounter: Payer: Self-pay | Admitting: Internal Medicine

## 2022-02-08 MED ORDER — WEGOVY 0.25 MG/0.5ML ~~LOC~~ SOAJ: 0.2500 mg | SUBCUTANEOUS | 0 refills | Status: DC

## 2022-02-09 ENCOUNTER — Telehealth: Payer: Self-pay

## 2022-02-09 NOTE — Telephone Encounter (Signed)
Wegovy medication has been approved.

## 2022-02-10 ENCOUNTER — Encounter: Payer: Self-pay | Admitting: Gastroenterology

## 2022-03-04 ENCOUNTER — Ambulatory Visit (AMBULATORY_SURGERY_CENTER): Payer: BC Managed Care – PPO

## 2022-03-04 VITALS — Ht 63.0 in | Wt 245.0 lb

## 2022-03-04 DIAGNOSIS — Z1211 Encounter for screening for malignant neoplasm of colon: Secondary | ICD-10-CM

## 2022-03-04 MED ORDER — NA SULFATE-K SULFATE-MG SULF 17.5-3.13-1.6 GM/177ML PO SOLN: 1.0000 | Freq: Once | ORAL | 0 refills | Status: AC

## 2022-03-04 NOTE — Progress Notes (Signed)
Pre visit completed via phone call; Patient verified name, DOB, and address;  No egg or soy allergy known to patient;  No issues known to pt with past sedation with any surgeries or procedures; Patient denies ever being told they had issues or difficulty with intubation;  No FH of Malignant Hyperthermia; Pt is not on diet pills; Pt is not on home 02;  Pt is not on blood thinners; Pt denies issues with constipation;  No A fib or A flutter; Have any cardiac testing pending--NO Pt instructed to use Singlecare.com or GoodRx for a price reduction on prep;   Insurance verified during [place] appt=[employer]  Patient's chart reviewed by Osvaldo Angst CNRA prior to previsit and patient appropriate for the [place].  Previsit completed and red dot placed by patient's name on their procedure day (on provider's schedule).    GoodRx coupon sent with instructions via mail to patient;

## 2022-03-23 ENCOUNTER — Encounter: Payer: Self-pay | Admitting: Gastroenterology

## 2022-03-23 ENCOUNTER — Encounter: Payer: Self-pay | Admitting: Certified Registered Nurse Anesthetist

## 2022-03-25 ENCOUNTER — Encounter: Payer: Self-pay | Admitting: Gastroenterology

## 2022-03-25 ENCOUNTER — Ambulatory Visit (AMBULATORY_SURGERY_CENTER): Payer: BC Managed Care – PPO | Admitting: Gastroenterology

## 2022-03-25 VITALS — BP 122/77 | HR 82 | Temp 97.7°F | Resp 18 | Ht 63.0 in | Wt 245.0 lb

## 2022-03-25 DIAGNOSIS — D125 Benign neoplasm of sigmoid colon: Secondary | ICD-10-CM | POA: Diagnosis not present

## 2022-03-25 DIAGNOSIS — D128 Benign neoplasm of rectum: Secondary | ICD-10-CM

## 2022-03-25 DIAGNOSIS — K641 Second degree hemorrhoids: Secondary | ICD-10-CM

## 2022-03-25 DIAGNOSIS — K635 Polyp of colon: Secondary | ICD-10-CM

## 2022-03-25 DIAGNOSIS — Z1211 Encounter for screening for malignant neoplasm of colon: Secondary | ICD-10-CM

## 2022-03-25 HISTORY — PX: COLONOSCOPY: SHX174

## 2022-03-25 MED ORDER — SODIUM CHLORIDE 0.9 % IV SOLN: 500.0000 mL | INTRAVENOUS | Status: DC

## 2022-03-25 NOTE — Progress Notes (Signed)
Called to room to assist during endoscopic procedure.  Patient ID and intended procedure confirmed with present staff. Received instructions for my participation in the procedure from the performing physician.  

## 2022-03-25 NOTE — Progress Notes (Signed)
Report given to PACU, vss 

## 2022-03-25 NOTE — Progress Notes (Signed)
Pt's states no medical or surgical changes since previsit or office visit. 

## 2022-03-25 NOTE — Patient Instructions (Addendum)
Recommendation: - Patient has a contact number available for                            emergencies. The signs and symptoms of potential                            delayed complications were discussed with the                            patient. Return to normal activities tomorrow.                            Written discharge instructions were provided to the                            patient.                           - Resume previous diet.                           - Continue present medications.                           - Await pathology results.                           - Repeat colonoscopy for surveillance based on                            pathology results.                           - Return to GI clinic PRN.  YOU HAD AN ENDOSCOPIC PROCEDURE TODAY AT [facility]:   Refer to the procedure report that was given to you for any specific questions about what was found during the examination.  If the procedure report does not answer your questions, please call your gastroenterologist to clarify.  If you requested that your care partner not be given the details of your procedure findings, then the procedure report has been included in a sealed envelope for you to review at your convenience later.  YOU SHOULD EXPECT: Some feelings of bloating in the abdomen. Passage of more gas than usual.  Walking can help get rid of the air that was put into your GI tract during the procedure and reduce the bloating. If you had a lower endoscopy (such as a colonoscopy or flexible sigmoidoscopy) you may notice spotting of blood in your stool or on the toilet paper. If you underwent a bowel prep for your procedure, you may not have a normal bowel movement for a few days.  Please Note:  You might notice some irritation and congestion in your nose or some drainage.  This is from the oxygen used during your procedure.  There is no need for concern and it should clear up in a day or so.  SYMPTOMS  TO REPORT IMMEDIATELY:  Following lower endoscopy (colonoscopy or flexible sigmoidoscopy):  Excessive amounts of blood in the stool  Significant tenderness or  worsening of abdominal pains  Swelling of the abdomen that is new, acute  Fever of 100F or higher  For urgent or emergent issues, a gastroenterologist can be reached at any hour by calling [phone]. Do not use MyChart messaging for urgent concerns.    DIET:  We do recommend a small meal at first, but then you may proceed to your regular diet.  Drink plenty of fluids but you should avoid alcoholic beverages for 24 hours.  ACTIVITY:  You should plan to take it easy for the rest of today and you should NOT DRIVE or use heavy machinery until tomorrow (because of the sedation medicines used during the test).    FOLLOW UP: Our staff will call the number listed on your records the next business day following your procedure.  We will call around 7:15- 8:00 am to check on you and address any questions or concerns that you may have regarding the information given to you following your procedure. If we do not reach you, we will leave a message.     If any biopsies were taken you will be contacted by phone or by letter within the next 1-3 weeks.  Please call us at [phone] if you have not heard about the biopsies in 3 weeks.    SIGNATURES/CONFIDENTIALITY: You and/or your care partner have signed paperwork which will be entered into your electronic medical record.  These signatures attest to the fact that that the information above on your After Visit Summary has been reviewed and is understood.  Full responsibility of the confidentiality of this discharge information lies with you and/or your care-partner.

## 2022-03-25 NOTE — Op Note (Signed)
[facility] Patient Name: Belinda Williams Procedure Date: 03/25/2022 1:32 PM MRN: [id number] Endoscopist: Gerrit Heck , MD, [id number] Age: 47 Referring MD:  Date of Birth: 1975/06/13 Gender: Female Account #: [id number] Procedure:                Colonoscopy Indications:              Screening for colorectal malignant neoplasm, This                            is the patient's first colonoscopy Medicines:                Monitored Anesthesia Care Procedure:                Pre-Anesthesia Assessment:                           - Prior to the procedure, a History and Physical                            was performed, and patient medications and                            allergies were reviewed. The patient's tolerance of                            previous anesthesia was also reviewed. The risks                            and benefits of the procedure and the sedation                            options and risks were discussed with the patient.                            All questions were answered, and informed consent                            was obtained. Prior Anticoagulants: The patient has                            taken no anticoagulant or antiplatelet agents. ASA                            Grade Assessment: III - A patient with severe                            systemic disease. After reviewing the risks and                            benefits, the patient was deemed in satisfactory                            condition to undergo the procedure.  After obtaining informed consent, the colonoscope                            was passed under direct vision. Throughout the                            procedure, the patient's blood pressure, pulse, and                            oxygen saturations were monitored continuously. The                            Olympus CF-HQ190L (Serial# [id number]) Colonoscope was                            introduced  through the anus and advanced to the the                            cecum, identified by the appendiceal orifice,                            ileocecal valve and palpation. The colonoscopy was                            performed without difficulty. The patient tolerated                            the procedure well. The quality of the bowel                            preparation was good. The ileocecal valve,                            appendiceal orifice, and rectum were photographed. Scope In: 1:40:02 PM Scope Out: 1:54:33 PM Scope Withdrawal Time: 0 hours 13 minutes 2 seconds  Total Procedure Duration: 0 hours 14 minutes 31 seconds  Findings:                 Hemorrhoids and skin tags were found on perianal                            exam.                           Five sessile polyps were found in the rectum (3)                            and distal sigmoid colon (2). The polyps were 1 to                            3 mm in size. These polyps were removed with a cold                            snare. Resection and retrieval  were complete.                            Estimated blood loss was minimal.                           Internal hemorrhoids were found during                            retroflexion. The hemorrhoids were medium-sized and                            Grade III (internal hemorrhoids that prolapse but                            require manual reduction). Complications:            No immediate complications. Estimated Blood Loss:     Estimated blood loss was minimal. Impression:               - Hemorrhoids found on perianal exam.                           - Five 1 to 3 mm polyps in the rectum and in the                            distal sigmoid colon, removed with a cold snare.                            Resected and retrieved.                           - The remainder of the colon was normal appearing.                           - Internal hemorrhoids. Recommendation:            - Patient has a contact number available for                            emergencies. The signs and symptoms of potential                            delayed complications were discussed with the                            patient. Return to normal activities tomorrow.                            Written discharge instructions were provided to the                            patient.                           - Resume previous diet.                           -  Continue present medications.                           - Await pathology results.                           - Repeat colonoscopy for surveillance based on                            pathology results.                           - Return to GI clinic PRN. Gerrit Heck, MD 03/25/2022 1:58:47 PM

## 2022-03-25 NOTE — Progress Notes (Signed)
GASTROENTEROLOGY PROCEDURE H&P NOTE   Primary Care Physician: Binnie Rail, MD    Reason for Procedure:  Colon Cancer screening  Plan:    Colonoscopy  Patient is appropriate for endoscopic procedure(s) in the ambulatory ([facility]) setting.  The nature of the procedure, as well as the risks, benefits, and alternatives were carefully and thoroughly reviewed with the patient. Ample time for discussion and questions allowed. The patient understood, was satisfied, and agreed to proceed.     HPI: Belinda Williams is a 47 y.o. female who presents for colonoscopy for routine Colon Cancer screening.  No active GI symptoms.   Mother w/ colon polyps and maternal uncle with rectal CA at age 87.  Past Medical History:  Diagnosis Date   ALLERGIC RHINITIS    Anemia    on meds   Asthma    as a child - no problem as adult   Headache(784.0)    Heart murmur    as a child - no problem as adult - closed per pt   Lactose intolerance    Lower back pain    MIGRAINE, MENSTRUAL    Morbid obesity ([place])    PAP SMEAR, ABNORMAL 08/23/2006   Thyroglossal duct cyst    Excision June 2013 by ENT ([facility])   Vitamin B 12 deficiency    Vitamin D deficiency    Weight gain     Past Surgical History:  Procedure Laterality Date   DERMOID CYST  EXCISION  08/2011   Neck   DILATATION & CURRETTAGE/HYSTEROSCOPY WITH RESECTOCOPE  02/2012   thyroductal cyst     vaginal cyst removed      Prior to Admission medications   Medication Sig Start Date End Date Taking? Authorizing Provider  Aspirin-Acetaminophen 500-325 MG PACK Take 1 tablet by mouth as needed (headache).    [provider]  Iron-Vitamin C (IRON 100/C) 100-250 MG TABS Take 1 tablet by mouth daily at 6 (six) AM. 09/10/19   [provider]  Semaglutide-Weight Management (WEGOVY) 0.25 MG/0.5ML SOAJ Inject 0.25 mg into the skin once a week. Patient not taking: Reported on 03/04/2022 02/08/22   Binnie Rail, MD  tranexamic acid  (LYSTEDA) 650 MG TABS tablet Take 1,300 mg by mouth 3 (three) times daily. Patient not taking: Reported on 03/04/2022 09/15/21   [provider]    Current Outpatient Medications  Medication Sig Dispense Refill   Aspirin-Acetaminophen 500-325 MG PACK Take 1 tablet by mouth as needed (headache).     Iron-Vitamin C (IRON 100/C) 100-250 MG TABS Take 1 tablet by mouth daily at 6 (six) AM.     Semaglutide-Weight Management (WEGOVY) 0.25 MG/0.5ML SOAJ Inject 0.25 mg into the skin once a week. (Patient not taking: Reported on 03/04/2022) 2 mL 0   tranexamic acid (LYSTEDA) 650 MG TABS tablet Take 1,300 mg by mouth 3 (three) times daily. (Patient not taking: Reported on 03/04/2022)     Current Facility-Administered Medications  Medication Dose Route Frequency Provider Last Rate Last Admin   0.9 %  sodium chloride infusion  500 mL Intravenous Continuous Aryaman Haliburton V, DO        Allergies as of 03/25/2022 - Review Complete 03/25/2022  Allergen Reaction Noted   Sulfonamide derivatives Nausea Only 08/23/2006   Ciprofloxacin Nausea Only and Rash 08/23/2006   Penicillins Nausea Only and Rash 08/23/2006    Family History  Problem Relation Age of Onset   Colon polyps Mother 33   Hypertension Mother    Sleep  apnea Father    Obesity Father    Thyroid disease Sister    Breast cancer Maternal Aunt 45   Rectal cancer Maternal Uncle 65   Diabetes Maternal Grandmother    Hypertension Maternal Grandmother    Stroke Maternal Grandmother 4   Hypertension Other    Colon cancer Neg Hx    Esophageal cancer Neg Hx    Stomach cancer Neg Hx     Social History   Socioeconomic History   Marital status: Single    Spouse name: Not on file   Number of children: Not on file   Years of education: Not on file   Highest education level: Not on file  Occupational History   Occupation: Mudlogger of social activities   Tobacco Use   Smoking status: Never   Smokeless tobacco: Never  Vaping Use    Vaping Use: Never used  Substance and Sexual Activity   Alcohol use: Yes    Alcohol/week: 0.0 - 2.0 standard drinks of alcohol    Comment: socially   Drug use: No   Sexual activity: Not Currently    Birth control/protection: None    Comment: Single, not sexual active at this time  Other Topics Concern   Not on file  Social History Narrative   Not on file   Social Determinants of Health   Financial Resource Strain: Not on file  Food Insecurity: Not on file  Transportation Needs: Not on file  Physical Activity: Not on file  Stress: Not on file  Social Connections: Not on file  Intimate Partner Violence: Not on file    Physical Exam: Vital signs in last 24 hours: '@BP'$  (!) 145/97   Pulse 89   Temp 97.7 F (36.5 C)   Resp (!) 24   Ht '5\' 3"'$  (1.6 m)   Wt 245 lb (111.1 kg)   LMP 03/23/2022   SpO2 100%   BMI 43.40 kg/m  GEN: NAD EYE: Sclerae anicteric ENT: MMM CV: Non-tachycardic Pulm: CTA b/l GI: Soft, NT/ND NEURO:  Alert & Oriented x 3   Gerrit Heck, DO [facility]   03/25/2022 1:35 PM

## 2022-03-26 ENCOUNTER — Telehealth: Payer: Self-pay | Admitting: *Deleted

## 2022-03-26 NOTE — Telephone Encounter (Signed)
  Follow up Call-     03/25/2022   12:46 PM  Call back number  Post procedure Call Back phone  # [phone]  Permission to leave phone message Yes     Patient questions:  Do you have a fever, pain , or abdominal swelling? No. Pain Score  0 *  Have you tolerated food without any problems? Yes.    Have you been able to return to your normal activities? Yes.    Do you have any questions about your discharge instructions: Diet   No. Medications  No. Follow up visit  No.  Do you have questions or concerns about your Care? No.  Actions: * If pain score is 4 or above: No action needed, pain <4.

## 2022-04-03 MED ORDER — ZEPBOUND 2.5 MG/0.5ML ~~LOC~~ SOAJ: 2.5000 mg | SUBCUTANEOUS | 0 refills | Status: DC

## 2022-04-03 NOTE — Addendum Note (Signed)
Addended by: Binnie Rail on: 04/03/2022 05:46 PM   Modules accepted: Orders

## 2022-04-05 ENCOUNTER — Encounter: Payer: Self-pay | Admitting: Gastroenterology

## 2022-09-13 ENCOUNTER — Other Ambulatory Visit: Payer: Self-pay

## 2022-09-13 ENCOUNTER — Emergency Department (HOSPITAL_COMMUNITY)
Admission: EM | Admit: 2022-09-13 | Discharge: 2022-09-13 | Disposition: A | Discharge status: Home / Self Care | Payer: BC Managed Care – PPO | Attending: Emergency Medicine | Admitting: Emergency Medicine

## 2022-09-13 ENCOUNTER — Encounter (HOSPITAL_COMMUNITY): Payer: Self-pay | Admitting: Emergency Medicine

## 2022-09-13 ENCOUNTER — Emergency Department (HOSPITAL_COMMUNITY): Payer: BC Managed Care – PPO

## 2022-09-13 DIAGNOSIS — K8020 Calculus of gallbladder without cholecystitis without obstruction: Secondary | ICD-10-CM | POA: Diagnosis not present

## 2022-09-13 DIAGNOSIS — Z7982 Long term (current) use of aspirin: Secondary | ICD-10-CM | POA: Diagnosis not present

## 2022-09-13 DIAGNOSIS — K7689 Other specified diseases of liver: Secondary | ICD-10-CM | POA: Diagnosis not present

## 2022-09-13 DIAGNOSIS — D649 Anemia, unspecified: Secondary | ICD-10-CM | POA: Diagnosis not present

## 2022-09-13 DIAGNOSIS — R109 Unspecified abdominal pain: Secondary | ICD-10-CM | POA: Diagnosis present

## 2022-09-13 LAB — CBC
HCT: 27.8 % — ABNORMAL LOW (ref 36.0–46.0)
Hemoglobin: 7.9 g/dL — ABNORMAL LOW (ref 12.0–15.0)
MCH: 17.4 pg — ABNORMAL LOW (ref 26.0–34.0)
MCHC: 28.4 g/dL — ABNORMAL LOW (ref 30.0–36.0)
MCV: 61.1 fL — ABNORMAL LOW (ref 80.0–100.0)
Platelets: 472 10*3/uL — ABNORMAL HIGH (ref 150–400)
RBC: 4.55 MIL/uL (ref 3.87–5.11)
RDW: 24.1 % — ABNORMAL HIGH (ref 11.5–15.5)
WBC: 8.4 10*3/uL (ref 4.0–10.5)
nRBC: 0 % (ref 0.0–0.2)

## 2022-09-13 LAB — URINALYSIS, ROUTINE W REFLEX MICROSCOPIC
Bilirubin Urine: NEGATIVE
Glucose, UA: NEGATIVE mg/dL
Hgb urine dipstick: NEGATIVE
Ketones, ur: NEGATIVE mg/dL
Leukocytes,Ua: NEGATIVE
Nitrite: NEGATIVE
Protein, ur: NEGATIVE mg/dL
Specific Gravity, Urine: 1.003 — ABNORMAL LOW (ref 1.005–1.030)
pH: 6 (ref 5.0–8.0)

## 2022-09-13 LAB — COMPREHENSIVE METABOLIC PANEL
ALT: 18 U/L (ref 0–44)
AST: 25 U/L (ref 15–41)
Albumin: 3 g/dL — ABNORMAL LOW (ref 3.5–5.0)
Alkaline Phosphatase: 83 U/L (ref 38–126)
Anion gap: 9 (ref 5–15)
BUN: <5 mg/dL — ABNORMAL LOW (ref 6–20)
CO2: 24 mmol/L (ref 22–32)
Calcium: 8.7 mg/dL — ABNORMAL LOW (ref 8.9–10.3)
Chloride: 101 mmol/L (ref 98–111)
Creatinine, Ser: 0.87 mg/dL (ref 0.44–1.00)
GFR, Estimated: >60 mL/min (ref >60)
Glucose, Bld: 100 mg/dL — ABNORMAL HIGH (ref 70–99)
Potassium: 4 mmol/L (ref 3.5–5.1)
Sodium: 134 mmol/L — ABNORMAL LOW (ref 135–145)
Total Bilirubin: 1 mg/dL (ref 0.3–1.2)
Total Protein: 7.8 g/dL (ref 6.5–8.1)

## 2022-09-13 LAB — HCG, SERUM, QUALITATIVE: Preg, Serum: NEGATIVE

## 2022-09-13 LAB — LIPASE, BLOOD: Lipase: 25 U/L (ref 11–51)

## 2022-09-13 MED ORDER — SODIUM CHLORIDE 0.9 % IV BOLUS (SEPSIS)
1000.0000 mL | Freq: Once | INTRAVENOUS | Status: AC
  Administered 2022-09-13: 1000 mL via INTRAVENOUS

## 2022-09-13 MED ORDER — ONDANSETRON 8 MG PO TBDP: 8.0000 mg | ORAL_TABLET | Freq: Three times a day (TID) | ORAL | 0 refills | Status: DC | PRN

## 2022-09-13 MED ORDER — MORPHINE SULFATE (PF) 4 MG/ML IV SOLN
4.0000 mg | Freq: Once | INTRAVENOUS | Status: AC
  Administered 2022-09-13: 4 mg via INTRAVENOUS
  Filled 2022-09-13: qty 1

## 2022-09-13 MED ORDER — HYDROCODONE-ACETAMINOPHEN 5-325 MG PO TABS: 1.0000 | ORAL_TABLET | Freq: Four times a day (QID) | ORAL | 0 refills | Status: DC | PRN

## 2022-09-13 MED ORDER — ONDANSETRON HCL 4 MG/2ML IJ SOLN
4.0000 mg | Freq: Once | INTRAMUSCULAR | Status: AC
  Administered 2022-09-13: 4 mg via INTRAVENOUS
  Filled 2022-09-13: qty 2

## 2022-09-13 MED ORDER — SODIUM CHLORIDE 0.9 % IV SOLN
1000.0000 mL | INTRAVENOUS | Status: DC
  Administered 2022-09-13: 1000 mL via INTRAVENOUS

## 2022-09-13 NOTE — ED Provider Notes (Signed)
[place] EMERGENCY DEPARTMENT AT [facility] Provider Note   CSN: [id number] Arrival date & time: 09/13/22  1529     History  Chief Complaint  Patient presents with   Abdominal Pain    Belinda Williams is a 47 y.o. female.   Abdominal Pain  Patient has history of migraines, B12 deficiency, iron deficiency anemia, heavy menstrual bleeding  Patient presents to the ED for evaluation of abdominal pain.  Patient states last week she started having symptoms of vomiting diarrhea abdominal pain.  She thought it was a stomach bug.  The vomiting and diarrhea resolved after couple of days.  The abdominal pain however has persisted.  She has been having pain in the right side of her abdomen that goes to her back.  She denies any dysuria.  No trouble with vaginal bleeding or discharge.  No chest pain or shortness of breath.  Patient denies any prior abdominal surgeries.  Home Medications Prior to Admission medications   Medication Sig Start Date End Date Taking? Authorizing Provider  HYDROcodone-acetaminophen (NORCO/VICODIN) 5-325 MG tablet Take 1 tablet by mouth every 6 (six) hours as needed. 09/13/22  Yes Linwood Dibbles, MD  ondansetron (ZOFRAN-ODT) 8 MG disintegrating tablet Take 1 tablet (8 mg total) by mouth every 8 (eight) hours as needed for nausea or vomiting. 09/13/22  Yes Linwood Dibbles, MD  Aspirin-Acetaminophen [phone] MG PACK Take 1 tablet by mouth as needed (headache).    [provider]  Iron-Vitamin C (IRON 100/C) 100-250 MG TABS Take 1 tablet by mouth daily at 6 (six) AM. 09/10/19   [provider]  tirzepatide (ZEPBOUND) 2.5 MG/0.5ML Pen Inject 2.5 mg into the skin once a week. 04/03/22   Pincus Sanes, MD      Allergies    Sulfonamide derivatives, Ciprofloxacin, and Penicillins    Review of Systems   Review of Systems  Gastrointestinal:  Positive for abdominal pain.    Physical Exam Updated Vital Signs BP 130/82 (BP Location: Left Arm)   Pulse 100    Temp 98.6 F (37 C) (Oral)   Resp 17   Ht 1.6 m (5\' 3" )   Wt 108.9 kg   SpO2 100%   BMI 42.51 kg/m  Physical Exam Vitals and nursing note reviewed.  Constitutional:      General: She is not in acute distress.    Appearance: She is well-developed.  HENT:     Head: Normocephalic and atraumatic.     Right Ear: External ear normal.     Left Ear: External ear normal.  Eyes:     General: No scleral icterus.       Right eye: No discharge.        Left eye: No discharge.     Conjunctiva/sclera: Conjunctivae normal.  Neck:     Trachea: No tracheal deviation.  Cardiovascular:     Rate and Rhythm: Normal rate and regular rhythm.  Pulmonary:     Effort: Pulmonary effort is normal. No respiratory distress.     Breath sounds: Normal breath sounds. No stridor. No wheezing or rales.  Abdominal:     General: Bowel sounds are normal. There is no distension.     Palpations: Abdomen is soft.     Tenderness: There is abdominal tenderness in the right upper quadrant. There is guarding. There is no rebound.  Musculoskeletal:        General: No tenderness or deformity.     Cervical back: Neck supple.  Skin:  General: Skin is warm and dry.     Findings: No rash.  Neurological:     General: No focal deficit present.     Mental Status: She is alert.     Cranial Nerves: No cranial nerve deficit, dysarthria or facial asymmetry.     Sensory: No sensory deficit.     Motor: No abnormal muscle tone or seizure activity.     Coordination: Coordination normal.  Psychiatric:        Mood and Affect: Mood normal.     ED Results / Procedures / Treatments   Labs (all labs ordered are listed, but only abnormal results are displayed) Labs Reviewed  COMPREHENSIVE METABOLIC PANEL - Abnormal; Notable for the following components:      Result Value   Sodium 134 (*)    Glucose, Bld 100 (*)    BUN <5 (*)    Calcium 8.7 (*)    Albumin 3.0 (*)    All other components within normal limits  CBC -  Abnormal; Notable for the following components:   Hemoglobin 7.9 (*)    HCT 27.8 (*)    MCV 61.1 (*)    MCH 17.4 (*)    MCHC 28.4 (*)    RDW 24.1 (*)    Platelets 472 (*)    All other components within normal limits  URINALYSIS, ROUTINE W REFLEX MICROSCOPIC - Abnormal; Notable for the following components:   Specific Gravity, Urine 1.003 (*)    All other components within normal limits  LIPASE, BLOOD  HCG, SERUM, QUALITATIVE    EKG None  Radiology US Abdomen Limited RUQ (LIVER/GB)  Result Date: 09/13/2022 CLINICAL DATA:  Right upper quadrant abdominal pain. EXAM: ULTRASOUND ABDOMEN LIMITED RIGHT UPPER QUADRANT COMPARISON:  None Available. FINDINGS: Gallbladder: There is layering sludge and small stones within the gallbladder. There is a 2 cm stone in the neck of the gallbladder. No gallbladder wall thickening or pericholecystic fluid. Negative sonographic Murphy's sign. Common bile duct: Diameter: 2 mm Liver: There is a 2.8 x 2.5 x 2.8 cm echogenic lesion in the right lobe of the liver which is not characterized on this ultrasound but the sonographic features are most suggestive of a hemangioma. Further characterization with MRI without and with contrast on a nonemergent/outpatient basis recommended. Portal vein is patent on color Doppler imaging with normal direction of blood flow towards the liver. Other: None. IMPRESSION: 1. Cholelithiasis without sonographic evidence of acute cholecystitis. 2. Echogenic lesion in the right lobe of the liver. Further characterization with MRI on a nonemergent basis recommended. Electronically Signed   By: Elgie Collard M.D.   On: 09/13/2022 19:53    Procedures Procedures    Medications Ordered in ED Medications  sodium chloride 0.9 % bolus 1,000 mL (1,000 mLs Intravenous New Bag/Given 09/13/22 1724)    Followed by  0.9 %  sodium chloride infusion (1,000 mLs Intravenous New Bag/Given 09/13/22 1724)  morphine (PF) 4 MG/ML injection 4 mg (4 mg  Intravenous Given 09/13/22 1724)  ondansetron (ZOFRAN) injection 4 mg (4 mg Intravenous Given 09/13/22 1725)    ED Course/ Medical Decision Making/ A&P Clinical Course as of 09/13/22 2038  Mon Sep 13, 2022  1638 Hemoglobin decreased.  Similar although decreased compared to recent values. [JK]  1638 Pregnancy test negative [JK]  1748 Urinalysis negative.  Metabolic panel normal [JK]  2000 Ultrasound shows evidence of gallstones without findings of cholecystitis. [JK]  2000 Incidental liver lesion noted.  Outpatient MRI recommended [JK]  Clinical Course User Index [JK] Linwood Dibbles, MD                             Medical Decision Making Problems Addressed: Gallstones: acute illness or injury that poses a threat to life or bodily functions  Amount and/or Complexity of Data Reviewed Labs: ordered. Decision-making details documented in ED Course. Radiology: ordered and independent interpretation performed.  Risk Prescription drug management.   Patient presented to the ED for evaluation of abdominal pain.  Patient noted to have right upper quadrant abdominal pain.  Patient's laboratory tests did not show any signs of hepatitis hyperbilirubinemia or pancreatitis.  Ultrasound was performed and it does show evidence of gallstones.  Suspect this is the etiology for her pain.  I have low suspicion for urinary etiology or appendicitis.  Patient is pain-free now.  Will have her follow-up with a general surgeon.  Incidental anemia and liver lesion noted.  Recommend outpatient follow-up with PCP.        Final Clinical Impression(s) / ED Diagnoses Final diagnoses:  Gallstones  Anemia, unspecified type  Liver cyst    Rx / DC Orders ED Discharge Orders          Ordered    HYDROcodone-acetaminophen (NORCO/VICODIN) 5-325 MG tablet  Every 6 hours PRN        09/13/22 2034    ondansetron (ZOFRAN-ODT) 8 MG disintegrating tablet  Every 8 hours PRN        09/13/22 2034               Linwood Dibbles, MD 09/13/22 2038

## 2022-09-13 NOTE — Discharge Instructions (Addendum)
Take the medications as needed for pain and nausea.  Follow-up with a general surgeon for further evaluation as we discussed.  Return to the ED for persistent pain fever persistent vomiting  Your lab tests also show persistent anemia.  Follow-up with a primary care doctor to have that rechecked.  Start taking iron supplement tablets in the meantime  The US showed an incidental probable cyst in the liver.  The radiologist did recommend a follow up MRI test.  Follow up with your primary care doctor to have that evaluated.

## 2022-09-13 NOTE — ED Triage Notes (Signed)
Pt endorses right side abd pain. States last weekend, she thought she had food poisoning and vomited for 2 days. Denies n/v/d since last week. Also having right upper back pain. Normal UA at [facility] yesterday.

## 2022-09-14 ENCOUNTER — Telehealth: Payer: Self-pay

## 2022-09-14 NOTE — Transitions of Care (Post Inpatient/ED Visit) (Signed)
   09/14/2022  Name: Debany Wisler MRN: [id number] DOB: July 03, 1975  Today's TOC FU Call Status: Today's TOC FU Call Status:: Successful TOC FU Call Competed TOC FU Call Complete Date: 09/14/22  Transition Care Management Follow-up Telephone Call Date of Discharge: 09/13/22 Discharge Facility: [facility]) Type of Discharge: Emergency Department Reason for ED Visit: Other: (cholecystitis) How have you been since you were released from the hospital?: Better Any questions or concerns?: No  Items Reviewed: Did you receive and understand the discharge instructions provided?: No Medications obtained,verified, and reconciled?: Yes (Medications Reviewed) Any new allergies since your discharge?: No Dietary orders reviewed?: Yes Do you have support at home?: Yes People in Home: spouse  Medications Reviewed Today: Medications Reviewed Today     Reviewed by Karena Addison, LPN (Licensed Practical Nurse) on 09/14/22 at 1053  Med List Status: <None>   Medication Order Taking? Sig Documenting Provider Last Dose Status Informant  Aspirin-Acetaminophen 500-325 MG PACK [id number] No Take 1 tablet by mouth as needed (headache). [provider] Unknown Active Self  HYDROcodone-acetaminophen (NORCO/VICODIN) 5-325 MG tablet [id number]  Take 1 tablet by mouth every 6 (six) hours as needed. Linwood Dibbles, MD  Active   Iron-Vitamin C (IRON 100/C) 100-250 MG TABS [id number] No Take 1 tablet by mouth daily at 6 (six) AM. [provider] 03/14/2022 Active   ondansetron (ZOFRAN-ODT) 8 MG disintegrating tablet [id number]  Take 1 tablet (8 mg total) by mouth every 8 (eight) hours as needed for nausea or vomiting. Linwood Dibbles, MD  Active   tirzepatide [facility]) 2.5 MG/0.5ML Pen [id number]  Inject 2.5 mg into the skin once a week. Pincus Sanes, MD  Active             Home Care and Equipment/Supplies: Were Home Health Services Ordered?: NA Any new equipment or medical supplies ordered?:  NA  Functional Questionnaire: Do you need assistance with bathing/showering or dressing?: No Do you need assistance with meal preparation?: No Do you need assistance with eating?: No Do you have difficulty maintaining continence: No Do you need assistance with getting out of bed/getting out of a chair/moving?: No Do you have difficulty managing or taking your medications?: No  Follow up appointments reviewed: PCP Follow-up appointment confirmed?: NA Specialist Hospital Follow-up appointment confirmed?: No Do you need transportation to your follow-up appointment?: No Do you understand care options if your condition(s) worsen?: Yes-patient verbalized understanding    SIGNATURE Karena Addison, LPN [facility] Nurse Health Advisor Direct Dial [phone]

## 2022-09-14 NOTE — Telephone Encounter (Signed)
Call previously completed

## 2022-09-19 ENCOUNTER — Encounter: Payer: Self-pay | Admitting: Internal Medicine

## 2022-09-19 DIAGNOSIS — K8020 Calculus of gallbladder without cholecystitis without obstruction: Secondary | ICD-10-CM | POA: Insufficient documentation

## 2022-09-19 DIAGNOSIS — K769 Liver disease, unspecified: Secondary | ICD-10-CM | POA: Insufficient documentation

## 2022-09-19 NOTE — Progress Notes (Unsigned)
Subjective:    Patient ID: Belinda Williams, female    DOB: 05-13-1975, 47 y.o.   MRN: [id number]     HPI Belinda Williams is here for follow up from the hospital.   7/1-ED -went for abdominal pain.  The week prior she started having vomiting, diarrhea and abdominal pain.  She thought it was a stomach bug.  The vomiting and diarrhea resolved after couple of days.  Abdominal pain persisted. Nothing she took helped.  Pain wass in the right abdomen.  No dysuria, vaginal bleeding/discharge.  No chest pain or shortness of breath.  She was tender in the right upper quadrant.  Workup in the ED revealed worsened anemia.  Ultrasound showed gallstones without acute cholecystitis.  She had a lesion in the liver suggestive of hemangioma-MRI was recommended for further evaluation.  She received pain medication and was pain-free.  Referred to surgery.  No pain since the ED.  Typically does not get GERD.   Medications and allergies reviewed with patient and updated if appropriate.  Current Outpatient Medications on File Prior to Visit  Medication Sig Dispense Refill   Aspirin-Acetaminophen 500-325 MG PACK Take 1 tablet by mouth as needed (headache).     Iron-Vitamin C (IRON 100/C) 100-250 MG TABS Take 1 tablet by mouth daily at 6 (six) AM.     ondansetron (ZOFRAN-ODT) 8 MG disintegrating tablet Take 1 tablet (8 mg total) by mouth every 8 (eight) hours as needed for nausea or vomiting. 12 tablet 0   tirzepatide (ZEPBOUND) 2.5 MG/0.5ML Pen Inject 2.5 mg into the skin once a week. 2 mL 0   HYDROcodone-acetaminophen (NORCO/VICODIN) 5-325 MG tablet Take 1 tablet by mouth every 6 (six) hours as needed. (Patient not taking: Reported on 09/20/2022) 12 tablet 0   No current facility-administered medications on file prior to visit.     Review of Systems  Constitutional:  Positive for fatigue. Negative for appetite change and fever.  Respiratory:  Negative for shortness of breath.   Cardiovascular:  Negative for  chest pain and palpitations.  Gastrointestinal:  Negative for abdominal pain, blood in stool, constipation, diarrhea, nausea and vomiting.       BM's almost normal.  No gerd  Genitourinary:  Positive for menstrual problem.  Neurological:  Negative for light-headedness and headaches.       Objective:   Vitals:   09/20/22 1355  BP: 116/78  Pulse: 82  Temp: 98.4 F (36.9 C)  SpO2: 98%   BP Readings from Last 3 Encounters:  09/20/22 116/78  09/13/22 125/78  03/25/22 122/77   Wt Readings from Last 3 Encounters:  09/20/22 237 lb (107.5 kg)  09/13/22 240 lb (108.9 kg)  03/25/22 245 lb (111.1 kg)   Body mass index is 41.98 kg/m.    Physical Exam Constitutional:      General: She is not in acute distress.    Appearance: Normal appearance. She is not ill-appearing.  HENT:     Head: Normocephalic and atraumatic.  Abdominal:     General: There is no distension.     Tenderness: There is no abdominal tenderness. There is no guarding or rebound.  Musculoskeletal:     Right lower leg: No edema.     Left lower leg: No edema.  Skin:    General: Skin is warm and dry.  Neurological:     Mental Status: She is alert.        Lab Results  Component Value Date   WBC 8.4  09/13/2022   HGB 7.9 (L) 09/13/2022   HCT 27.8 (L) 09/13/2022   PLT 472 (H) 09/13/2022   GLUCOSE 100 (H) 09/13/2022   CHOL 150 01/29/2022   TRIG 54.0 01/29/2022   HDL 36.90 (L) 01/29/2022   LDLCALC 102 (H) 01/29/2022   ALT 18 09/13/2022   AST 25 09/13/2022   NA 134 (L) 09/13/2022   K 4.0 09/13/2022   CL 101 09/13/2022   CREATININE 0.87 09/13/2022   BUN <5 (L) 09/13/2022   CO2 24 09/13/2022   TSH 2.30 01/29/2022   HGBA1C 5.9 01/29/2022   US Abdomen Limited RUQ (LIVER/GB) CLINICAL DATA:  Right upper quadrant abdominal pain.  EXAM: ULTRASOUND ABDOMEN LIMITED RIGHT UPPER QUADRANT  COMPARISON:  None Available.  FINDINGS: Gallbladder:  There is layering sludge and small stones within the  gallbladder. There is a 2 cm stone in the neck of the gallbladder. No gallbladder wall thickening or pericholecystic fluid. Negative sonographic Murphy's sign.  Common bile duct:  Diameter: 2 mm  Liver:  There is a 2.8 x 2.5 x 2.8 cm echogenic lesion in the right lobe of the liver which is not characterized on this ultrasound but the sonographic features are most suggestive of a hemangioma. Further characterization with MRI without and with contrast on a nonemergent/outpatient basis recommended. Portal vein is patent on color Doppler imaging with normal direction of blood flow towards the liver.  Other: None.  IMPRESSION: 1. Cholelithiasis without sonographic evidence of acute cholecystitis. 2. Echogenic lesion in the right lobe of the liver. Further characterization with MRI on a nonemergent basis recommended.  Electronically Signed   By: Elgie Collard M.D.   On: 09/13/2022 19:53    Assessment & Plan:    See Problem List for Assessment and Plan of chronic medical problems.    I spent 20 minutes dedicated to the care of this patient on the date of this encounter including review of recent labs, imaging, ED notes, obtaining history, communicating with the patient, ordering tests, referral, and documenting clinical information in the EHR

## 2022-09-19 NOTE — Patient Instructions (Signed)
      Blood work was ordered.   The lab is on the first floor.    Medications changes include :       A referral was ordered and someone will call you to schedule an appointment.     No follow-ups on file.

## 2022-09-20 ENCOUNTER — Ambulatory Visit: Payer: BC Managed Care – PPO | Admitting: Internal Medicine

## 2022-09-20 VITALS — BP 116/78 | HR 82 | Temp 98.4°F | Ht 63.0 in | Wt 237.0 lb

## 2022-09-20 DIAGNOSIS — K769 Liver disease, unspecified: Secondary | ICD-10-CM | POA: Diagnosis not present

## 2022-09-20 DIAGNOSIS — K8020 Calculus of gallbladder without cholecystitis without obstruction: Secondary | ICD-10-CM | POA: Diagnosis not present

## 2022-09-20 DIAGNOSIS — D509 Iron deficiency anemia, unspecified: Secondary | ICD-10-CM

## 2022-09-20 NOTE — Assessment & Plan Note (Signed)
New Seen on [place] Probable hemangioma Will order MRI to eval further

## 2022-09-20 NOTE — Assessment & Plan Note (Addendum)
Chronic Related to heavy menses asymptomatic Has not been taking iron as regularly as she should - just started taking it regularly Still having heavy periods - has tried birth control - caused increased bleeding.  Was on a different pill that helped - has not follow up with gyn -- advised f/u Increase iron to 2 tabs/ day

## 2022-09-20 NOTE — Assessment & Plan Note (Signed)
New Episode recently of abdominal pain likely GB related - has stone that is 2 cm, multiple small stone and GB slude Will refer to surgery  No further pain since ED visit

## 2023-03-14 ENCOUNTER — Encounter: Payer: Self-pay | Admitting: Physician Assistant

## 2023-05-20 ENCOUNTER — Encounter: Payer: Self-pay | Admitting: Physician Assistant

## 2023-06-12 ENCOUNTER — Encounter: Payer: Self-pay | Admitting: Internal Medicine

## 2023-06-12 NOTE — Progress Notes (Unsigned)
 Subjective:    Patient ID: Belinda Williams, female    DOB: 09/19/1975, 48 y.o.   MRN: [id number]      HPI Belinda Williams is here for a Physical exam and her chronic medical problems.   Doing well - no concerns.   Medications and allergies reviewed with patient and updated if appropriate.  Current Outpatient Medications on File Prior to Visit  Medication Sig Dispense Refill   Aspirin-Acetaminophen 500-325 MG PACK Take 1 tablet by mouth as needed (headache).     Iron-Vitamin C (IRON 100/C) 100-250 MG TABS Take 1 tablet by mouth daily at 6 (six) AM.     tranexamic acid (LYSTEDA) 650 MG TABS tablet Take 1,300 mg by mouth 3 (three) times daily.     No current facility-administered medications on file prior to visit.    Review of Systems  Constitutional:  Positive for fatigue. Negative for fever.  Eyes:  Negative for visual disturbance.  Respiratory:  Negative for cough, shortness of breath and wheezing.   Cardiovascular:  Negative for chest pain, palpitations and leg swelling.  Gastrointestinal:  Negative for abdominal pain, blood in stool, constipation and diarrhea.       No gerd  Genitourinary:  Negative for dysuria.  Musculoskeletal:  Positive for back pain (mild lower back pain). Negative for arthralgias.  Skin:  Negative for rash.  Neurological:  Negative for light-headedness and headaches.  Psychiatric/Behavioral:  Positive for sleep disturbance (4-5 hrs - interrupted - wakes a lot). Negative for dysphoric mood. The patient is not nervous/anxious.        Objective:   Vitals:   06/16/23 0844  BP: 118/80  Pulse: 85  Temp: 98 F (36.7 C)  SpO2: 95%   Filed Weights   06/16/23 0844  Weight: 225 lb (102.1 kg)   Body mass index is 39.86 kg/m.  BP Readings from Last 3 Encounters:  06/16/23 118/80  09/20/22 116/78  09/13/22 125/78    Wt Readings from Last 3 Encounters:  06/16/23 225 lb (102.1 kg)  09/20/22 237 lb (107.5 kg)  09/13/22 240 lb (108.9 kg)        Physical Exam Constitutional: She appears well-developed and well-nourished. No distress.  HENT:  Head: Normocephalic and atraumatic.  Right Ear: External ear normal. Normal ear canal and TM Left Ear: External ear normal.  Normal ear canal and TM Mouth/Throat: Oropharynx is clear and moist.  Eyes: Conjunctivae normal.  Neck: Neck supple. No tracheal deviation present. No thyromegaly present.  No carotid bruit  Cardiovascular: Normal rate, regular rhythm and normal heart sounds.   No murmur heard.  No edema. Pulmonary/Chest: Effort normal and breath sounds normal. No respiratory distress. She has no wheezes. She has no rales.  Breast: deferred   Abdominal: Soft. She exhibits no distension. There is no tenderness.  Lymphadenopathy: She has no cervical adenopathy.  Skin: Skin is warm and dry. She is not diaphoretic.  Psychiatric: She has a normal mood and affect. Her behavior is normal.     Lab Results  Component Value Date   WBC 8.4 09/13/2022   HGB 7.9 (L) 09/13/2022   HCT 27.8 (L) 09/13/2022   PLT 472 (H) 09/13/2022   GLUCOSE 100 (H) 09/13/2022   CHOL 150 01/29/2022   TRIG 54.0 01/29/2022   HDL 36.90 (L) 01/29/2022   LDLCALC 102 (H) 01/29/2022   ALT 18 09/13/2022   AST 25 09/13/2022   NA 134 (L) 09/13/2022   K 4.0 09/13/2022   CL 101 09/13/2022  CREATININE 0.87 09/13/2022   BUN <5 (L) 09/13/2022   CO2 24 09/13/2022   TSH 2.30 01/29/2022   HGBA1C 5.9 01/29/2022         Assessment & Plan:   Physical exam: Screening blood work  ordered Exercise  a little  - walking at lunch - encouraged increasing exercise Weight  obese  Substance abuse  none   Reviewed recommended immunizations.   Health Maintenance  Topic Date Due   MAMMOGRAM  Never done   Cervical Cancer Screening (HPV/Pap Cotest)  07/14/2015   COVID-19 Vaccine (4 - 2024-25 season) 07/02/2023 (Originally 11/14/2022)   INFLUENZA VACCINE  10/14/2023   DTaP/Tdap/Td (3 - Td or Tdap) 09/10/2027    Colonoscopy  03/25/2029   Hepatitis C Screening  Completed   HIV Screening  Completed   HPV VACCINES  Aged Out   Pneumococcal Vaccine 67-73 Years old  Discontinued          See Problem List for Assessment and Plan of chronic medical problems.

## 2023-06-12 NOTE — Patient Instructions (Incomplete)
 Blood work was ordered.       Medications changes include :   None    A referral was ordered and someone will call you to schedule an appointment.     No follow-ups on file.   Health Maintenance, Female Adopting a healthy lifestyle and getting preventive care are important in promoting health and wellness. Ask your health care provider about: The right schedule for you to have regular tests and exams. Things you can do on your own to prevent diseases and keep yourself healthy. What should I know about diet, weight, and exercise? Eat a healthy diet  Eat a diet that includes plenty of vegetables, fruits, low-fat dairy products, and lean protein. Do not eat a lot of foods that are high in solid fats, added sugars, or sodium. Maintain a healthy weight Body mass index (BMI) is used to identify weight problems. It estimates body fat based on height and weight. Your health care provider can help determine your BMI and help you achieve or maintain a healthy weight. Get regular exercise Get regular exercise. This is one of the most important things you can do for your health. Most adults should: Exercise for at least 150 minutes each week. The exercise should increase your heart rate and make you sweat (moderate-intensity exercise). Do strengthening exercises at least twice a week. This is in addition to the moderate-intensity exercise. Spend less time sitting. Even light physical activity can be beneficial. Watch cholesterol and blood lipids Have your blood tested for lipids and cholesterol at 48 years of age, then have this test every 5 years. Have your cholesterol levels checked more often if: Your lipid or cholesterol levels are high. You are older than 48 years of age. You are at high risk for heart disease. What should I know about cancer screening? Depending on your health history and family history, you may need to have cancer screening at various ages. This may include  screening for: Breast cancer. Cervical cancer. Colorectal cancer. Skin cancer. Lung cancer. What should I know about heart disease, diabetes, and high blood pressure? Blood pressure and heart disease High blood pressure causes heart disease and increases the risk of stroke. This is more likely to develop in people who have high blood pressure readings or are overweight. Have your blood pressure checked: Every 3-5 years if you are 40-7 years of age. Every year if you are 51 years old or older. Diabetes Have regular diabetes screenings. This checks your fasting blood sugar level. Have the screening done: Once every three years after age 78 if you are at a normal weight and have a low risk for diabetes. More often and at a younger age if you are overweight or have a high risk for diabetes. What should I know about preventing infection? Hepatitis B If you have a higher risk for hepatitis B, you should be screened for this virus. Talk with your health care provider to find out if you are at risk for hepatitis B infection. Hepatitis C Testing is recommended for: Everyone born from [birth year] through 1965. Anyone with known risk factors for hepatitis C. Sexually transmitted infections (STIs) Get screened for STIs, including gonorrhea and chlamydia, if: You are sexually active and are younger than 48 years of age. You are older than 48 years of age and your health care provider tells you that you are at risk for this type of infection. Your sexual activity has changed since you were last screened,  and you are at increased risk for chlamydia or gonorrhea. Ask your health care provider if you are at risk. Ask your health care provider about whether you are at high risk for HIV. Your health care provider may recommend a prescription medicine to help prevent HIV infection. If you choose to take medicine to prevent HIV, you should first get tested for HIV. You should then be tested every 3 months for as  long as you are taking the medicine. Pregnancy If you are about to stop having your period (premenopausal) and you may become pregnant, seek counseling before you get pregnant. Take 400 to 800 micrograms (mcg) of folic acid every day if you become pregnant. Ask for birth control (contraception) if you want to prevent pregnancy. Osteoporosis and menopause Osteoporosis is a disease in which the bones lose minerals and strength with aging. This can result in bone fractures. If you are 59 years old or older, or if you are at risk for osteoporosis and fractures, ask your health care provider if you should: Be screened for bone loss. Take a calcium or vitamin D supplement to lower your risk of fractures. Be given hormone replacement therapy (HRT) to treat symptoms of menopause. Follow these instructions at home: Alcohol use Do not drink alcohol if: Your health care provider tells you not to drink. You are pregnant, may be pregnant, or are planning to become pregnant. If you drink alcohol: Limit how much you have to: 0-1 drink a day. Know how much alcohol is in your drink. In the [place] one drink equals one 12 oz bottle of beer (355 mL), one 5 oz glass of wine (148 mL), or one 1 oz glass of hard liquor (44 mL). Lifestyle Do not use any products that contain nicotine or tobacco. These products include cigarettes, chewing tobacco, and vaping devices, such as e-cigarettes. If you need help quitting, ask your health care provider. Do not use street drugs. Do not share needles. Ask your health care provider for help if you need support or information about quitting drugs. General instructions Schedule regular health, dental, and eye exams. Stay current with your vaccines. Tell your health care provider if: You often feel depressed. You have ever been abused or do not feel safe at home. Summary Adopting a healthy lifestyle and getting preventive care are important in promoting health and  wellness. Follow your health care provider's instructions about healthy diet, exercising, and getting tested or screened for diseases. Follow your health care provider's instructions on monitoring your cholesterol and blood pressure. This information is not intended to replace advice given to you by your health care provider. Make sure you discuss any questions you have with your health care provider. Document Revised: 07/21/2020 Document Reviewed: 07/21/2020 Elsevier Patient Education  2024 [employer].

## 2023-06-16 ENCOUNTER — Encounter: Payer: Self-pay | Admitting: Internal Medicine

## 2023-06-16 ENCOUNTER — Ambulatory Visit (INDEPENDENT_AMBULATORY_CARE_PROVIDER_SITE_OTHER): Admitting: Internal Medicine

## 2023-06-16 VITALS — BP 118/80 | HR 85 | Temp 98.0°F | Ht 63.0 in | Wt 225.0 lb

## 2023-06-16 DIAGNOSIS — Z0000 Encounter for general adult medical examination without abnormal findings: Secondary | ICD-10-CM

## 2023-06-16 DIAGNOSIS — E538 Deficiency of other specified B group vitamins: Secondary | ICD-10-CM

## 2023-06-16 DIAGNOSIS — N920 Excessive and frequent menstruation with regular cycle: Secondary | ICD-10-CM

## 2023-06-16 DIAGNOSIS — D509 Iron deficiency anemia, unspecified: Secondary | ICD-10-CM

## 2023-06-16 DIAGNOSIS — E042 Nontoxic multinodular goiter: Secondary | ICD-10-CM | POA: Diagnosis not present

## 2023-06-16 DIAGNOSIS — K8020 Calculus of gallbladder without cholecystitis without obstruction: Secondary | ICD-10-CM

## 2023-06-16 DIAGNOSIS — R7303 Prediabetes: Secondary | ICD-10-CM | POA: Diagnosis not present

## 2023-06-16 LAB — COMPREHENSIVE METABOLIC PANEL WITH GFR
ALT: 6 U/L (ref 0–35)
AST: 9 U/L (ref 0–37)
Albumin: 3.6 g/dL (ref 3.5–5.2)
Alkaline Phosphatase: 77 U/L (ref 39–117)
BUN: 11 mg/dL (ref 6–23)
CO2: 28 meq/L (ref 19–32)
Calcium: 8.8 mg/dL (ref 8.4–10.5)
Chloride: 105 meq/L (ref 96–112)
Creatinine, Ser: 0.82 mg/dL (ref 0.40–1.20)
GFR: 85.15 mL/min (ref >60.00)
Glucose, Bld: 82 mg/dL (ref 70–99)
Potassium: 4.5 meq/L (ref 3.5–5.1)
Sodium: 138 meq/L (ref 135–145)
Total Bilirubin: 0.4 mg/dL (ref 0.2–1.2)
Total Protein: 7 g/dL (ref 6.0–8.3)

## 2023-06-16 LAB — CBC WITH DIFFERENTIAL/PLATELET
Basophils Absolute: 0 10*3/uL (ref 0.0–0.1)
Basophils Relative: 0.7 % (ref 0.0–3.0)
Eosinophils Absolute: 0.2 10*3/uL (ref 0.0–0.7)
Eosinophils Relative: 4.1 % (ref 0.0–5.0)
HCT: 36 % (ref 36.0–46.0)
Hemoglobin: 11.4 g/dL — ABNORMAL LOW (ref 12.0–15.0)
Lymphocytes Relative: 33.1 % (ref 12.0–46.0)
Lymphs Abs: 1.4 10*3/uL (ref 0.7–4.0)
MCHC: 31.6 g/dL (ref 30.0–36.0)
MCV: 74.7 fl — ABNORMAL LOW (ref 78.0–100.0)
Monocytes Absolute: 0.2 10*3/uL (ref 0.1–1.0)
Monocytes Relative: 5.6 % (ref 3.0–12.0)
Neutro Abs: 2.3 10*3/uL (ref 1.4–7.7)
Neutrophils Relative %: 56.5 % (ref 43.0–77.0)
Platelets: 321 10*3/uL (ref 150.0–400.0)
RBC: 4.82 Mil/uL (ref 3.87–5.11)
RDW: 21.7 % — ABNORMAL HIGH (ref 11.5–15.5)
WBC: 4.2 10*3/uL (ref 4.0–10.5)

## 2023-06-16 LAB — IBC PANEL
Iron: 125 ug/dL (ref 42–145)
Saturation Ratios: 36.7 % (ref 20.0–50.0)
TIBC: 340.2 ug/dL (ref 250.0–450.0)
Transferrin: 243 mg/dL (ref 212.0–360.0)

## 2023-06-16 LAB — LIPID PANEL
Cholesterol: 144 mg/dL (ref 0–200)
HDL: 39.4 mg/dL (ref >39.00)
LDL Cholesterol: 95 mg/dL (ref 0–99)
NonHDL: 104.74
Total CHOL/HDL Ratio: 4
Triglycerides: 49 mg/dL (ref 0.0–149.0)
VLDL: 9.8 mg/dL (ref 0.0–40.0)

## 2023-06-16 LAB — TSH: TSH: 1.33 u[IU]/mL (ref 0.35–5.50)

## 2023-06-16 LAB — FERRITIN: Ferritin: 8.5 ng/mL — ABNORMAL LOW (ref 10.0–291.0)

## 2023-06-16 LAB — VITAMIN B12: Vitamin B-12: 380 pg/mL (ref 211–911)

## 2023-06-16 LAB — HEMOGLOBIN A1C: Hgb A1c MFr Bld: 5.1 % (ref 4.6–6.5)

## 2023-06-16 NOTE — Assessment & Plan Note (Signed)
 Had one episode only so did not have surgery Avoid high fat foods asymptomatic

## 2023-06-16 NOTE — Assessment & Plan Note (Signed)
Chronic ?Check B12 level ?

## 2023-06-16 NOTE — Assessment & Plan Note (Signed)
 Chronic Stable by imaging and biopsy years ago negative TSH

## 2023-06-16 NOTE — Assessment & Plan Note (Addendum)
 Chronic Following with gyn improved On tranexamic acid 1300 mg daily

## 2023-06-16 NOTE — Assessment & Plan Note (Addendum)
 Chronic Related to heavy menses asymptomatic Taking iron daily No longer having heavy periods  following with her gynecologist regularly Check CBC, iron levels

## 2023-06-16 NOTE — Assessment & Plan Note (Signed)
 Chronic Lab Results  Component Value Date   HGBA1C 5.9 01/29/2022   Check a1c Low sugar / carb diet Stressed regular exercise

## 2023-06-16 NOTE — Assessment & Plan Note (Addendum)
 Chronic BMI today 39.86 She has been trying to lose weight  Encouraged increasing exercise Healthy diet, decreased portions

## 2023-09-04 ENCOUNTER — Encounter: Payer: Self-pay | Admitting: Internal Medicine

## 2023-09-04 DIAGNOSIS — R7303 Prediabetes: Secondary | ICD-10-CM

## 2023-09-04 MED ORDER — ZEPBOUND 2.5 MG/0.5ML ~~LOC~~ SOAJ: 2.5000 mg | SUBCUTANEOUS | 0 refills | Status: DC

## 2023-09-07 ENCOUNTER — Telehealth: Payer: Self-pay

## 2023-09-07 ENCOUNTER — Encounter: Payer: Self-pay | Admitting: Physician Assistant

## 2023-09-07 ENCOUNTER — Other Ambulatory Visit (HOSPITAL_COMMUNITY): Payer: Self-pay

## 2023-09-07 NOTE — Telephone Encounter (Signed)
 Pharmacy Patient Advocate Encounter   Received notification from Patient Advice Request messages that prior authorization for Zepbound  2.5mg /0.64ml is required/requested.   Insurance verification completed.   The patient is insured through [facility] .   Per test claim: Refill too soon. PA is not needed at this time. Medication was filled 09/04/23. Next eligible fill date is 09/25/23.  Per the ins rep, [employer] - weight loss meds are excluded from coverage and the patient is responsible for the full cost of medication. A prior authorization is not required and may use coupons to help with the cost of the copay.   Per the [facility] the copay is $1040.40.   Patient can check [email or web address]

## 2023-09-08 NOTE — Telephone Encounter (Signed)
 Info sent to patient

## 2023-09-15 ENCOUNTER — Other Ambulatory Visit: Payer: Self-pay

## 2023-09-15 MED ORDER — ZEPBOUND 2.5 MG/0.5ML ~~LOC~~ SOAJ
2.5000 mg | SUBCUTANEOUS | 0 refills | Status: DC
Start: 2023-09-15 — End: 2023-12-02

## 2023-10-05 ENCOUNTER — Other Ambulatory Visit: Payer: Self-pay

## 2023-10-05 MED ORDER — TIRZEPATIDE-WEIGHT MANAGEMENT 2.5 MG/0.5ML ~~LOC~~ SOLN: 2.5000 mg | SUBCUTANEOUS | 2 refills | Status: DC

## 2023-11-11 ENCOUNTER — Encounter

## 2023-12-02 ENCOUNTER — Other Ambulatory Visit: Payer: Self-pay | Admitting: Internal Medicine

## 2023-12-02 ENCOUNTER — Telehealth: Payer: Self-pay

## 2023-12-02 MED ORDER — TIRZEPATIDE-WEIGHT MANAGEMENT 5 MG/0.5ML ~~LOC~~ SOLN: 5.0000 mg | SUBCUTANEOUS | Status: DC

## 2023-12-02 MED ORDER — TIRZEPATIDE-WEIGHT MANAGEMENT 5 MG/0.5ML ~~LOC~~ SOLN: 5.0000 mg | SUBCUTANEOUS | 0 refills | Status: DC

## 2023-12-02 NOTE — Telephone Encounter (Signed)
 Copied from CRM #[id number]. Topic: Clinical - Medication Question >> Dec 02, 2023  3:56 PM Belinda Williams wrote: Reason for CRM: Patient is requesting a callback from nurse in regards to ZEPBOUND . She is wanting to increase dosage to the next one.

## 2023-12-02 NOTE — Telephone Encounter (Signed)
 Patient been taking the medication regularly looks like she has not been on it for a little while.  I will go ahead and send in the 5 mg dose, but I want her to schedule an appointment in about 1 month and follow-up with me in the office before she needs her next refill.

## 2023-12-02 NOTE — Addendum Note (Signed)
 Addended by: GEOFM GLADE PARAS on: 12/02/2023 04:31 PM   Modules accepted: Orders

## 2023-12-25 ENCOUNTER — Encounter: Payer: Self-pay | Admitting: Internal Medicine

## 2023-12-25 NOTE — Progress Notes (Unsigned)
      Subjective:    Patient ID: Belinda Williams, female    DOB: [birth year], 48 y.o.   MRN: [id number]     HPI Belinda Williams is here for follow up of her chronic medical problems.  Has been on zepbound  x 6 weeks - currently on 5 mg weekly.  She is getting the medication from [facility].  No change in appetite.  No change in weight.  Does not get full faster.  ? Slightly decreased craving for sugar    Walks 2-3 miles every 2 days -- trying to increase.  Doing better with protein intake.    Medications and allergies reviewed with patient and updated if appropriate.  Current Outpatient Medications on File Prior to Visit  Medication Sig Dispense Refill   Aspirin-Acetaminophen  500-325 MG PACK Take 1 tablet by mouth as needed (headache).     Belinda Williams 24 FE 1-20 MG-MCG(24) tablet Take 1 tablet by mouth daily.     Iron -Vitamin C (IRON  100/C) 100-250 MG TABS Take 1 tablet by mouth daily at 6 (six) AM.     tirzepatide  5 MG/0.5ML injection vial Inject 5 mg into the skin once a week. 2 mL 0   tranexamic acid (LYSTEDA) 650 MG TABS tablet Take 1,300 mg by mouth 3 (three) times daily.     No current facility-administered medications on file prior to visit.     Review of Systems  Constitutional:  Negative for appetite change.  Gastrointestinal:  Negative for abdominal pain, constipation and nausea.       Objective:   Vitals:   12/27/23 0857  BP: 122/76  Pulse: 87  Temp: 98.3 F (36.8 C)  SpO2: 95%   BP Readings from Last 3 Encounters:  12/27/23 122/76  06/16/23 118/80  09/20/22 116/78   Wt Readings from Last 3 Encounters:  12/27/23 230 lb (104.3 kg)  06/16/23 225 lb (102.1 kg)  09/20/22 237 lb (107.5 kg)   Body mass index is 40.74 kg/m.    Physical Exam Constitutional:      General: She is not in acute distress.    Appearance: Normal appearance. She is not ill-appearing.  HENT:     Head: Normocephalic and atraumatic.  Skin:    General: Skin is warm and dry.   Neurological:     Mental Status: She is alert. Mental status is at baseline.  Psychiatric:        Mood and Affect: Mood normal.        Behavior: Behavior normal.        Thought Content: Thought content normal.        Judgment: Judgment normal.        Lab Results  Component Value Date   WBC 4.2 06/16/2023   HGB 11.4 (L) 06/16/2023   HCT 36.0 06/16/2023   PLT 321.0 06/16/2023   GLUCOSE 82 06/16/2023   CHOL 144 06/16/2023   TRIG 49.0 06/16/2023   HDL 39.40 06/16/2023   LDLCALC 95 06/16/2023   ALT 6 06/16/2023   AST 9 06/16/2023   NA 138 06/16/2023   K 4.5 06/16/2023   CL 105 06/16/2023   CREATININE 0.82 06/16/2023   BUN 11 06/16/2023   CO2 28 06/16/2023   TSH 1.33 06/16/2023   HGBA1C 5.1 06/16/2023     Assessment & Plan:    See Problem List for Assessment and Plan of chronic medical problems.

## 2023-12-27 ENCOUNTER — Ambulatory Visit: Admitting: Internal Medicine

## 2023-12-27 DIAGNOSIS — Z6841 Body Mass Index (BMI) 40.0 and over, adult: Secondary | ICD-10-CM | POA: Diagnosis not present

## 2023-12-27 DIAGNOSIS — E559 Vitamin D deficiency, unspecified: Secondary | ICD-10-CM | POA: Insufficient documentation

## 2023-12-27 MED ORDER — ZEPBOUND 7.5 MG/0.5ML ~~LOC~~ SOLN: 7.5000 mg | SUBCUTANEOUS | 0 refills | Status: DC

## 2023-12-27 NOTE — Assessment & Plan Note (Addendum)
 Chronic Has been on zepbound  x 6 weeks - currently on 5mg  weekly - unfortunately she has not had any change in appetite, feeling full quicker for weight loss Discussed increasing to 7.5 mg weekly which may give her your blood counts, thyroid  function, sugar, kidney function and liver tests are normal.  Your cholesterol is very good.  Versus changing to Ozempic We both agreed to try Zepbound  7.5 mg weekly-prescription sent to pharmacy Continue regular exercise-increase-goal is 150 minutes/week-encouraged doing weights Diet high in protein, fiber, vegetables-smaller portions

## 2023-12-27 NOTE — Patient Instructions (Signed)
       Medications changes include :   increase zepbound  to 7.5 mg weekly

## 2024-01-17 ENCOUNTER — Other Ambulatory Visit: Payer: Self-pay | Admitting: Internal Medicine

## 2024-03-26 ENCOUNTER — Other Ambulatory Visit: Payer: Self-pay | Admitting: Interventional Radiology

## 2024-03-26 ENCOUNTER — Other Ambulatory Visit: Payer: Self-pay | Admitting: Obstetrics & Gynecology

## 2024-03-26 DIAGNOSIS — D259 Leiomyoma of uterus, unspecified: Secondary | ICD-10-CM

## 2024-03-30 ENCOUNTER — Ambulatory Visit (HOSPITAL_COMMUNITY)
Admission: RE | Admit: 2024-03-30 | Discharge: 2024-03-30 | Disposition: A | Discharge status: Home / Self Care | Source: Ambulatory Visit | Attending: Interventional Radiology | Admitting: Interventional Radiology

## 2024-03-30 DIAGNOSIS — D259 Leiomyoma of uterus, unspecified: Secondary | ICD-10-CM | POA: Diagnosis present

## 2024-03-30 MED ORDER — GADOBUTROL 1 MMOL/ML IV SOLN
10.0000 mL | Freq: Once | INTRAVENOUS | Status: AC | PRN
  Administered 2024-03-30: 10 mL via INTRAVENOUS

## 2024-04-10 ENCOUNTER — Inpatient Hospital Stay
Admission: RE | Admit: 2024-04-10 | Discharge: 2024-04-10 | Disposition: A | Source: Ambulatory Visit | Attending: Obstetrics & Gynecology | Admitting: Obstetrics & Gynecology

## 2024-04-10 DIAGNOSIS — D259 Leiomyoma of uterus, unspecified: Secondary | ICD-10-CM

## 2024-04-10 NOTE — Consult Note (Signed)
 "      Chief Complaint: Patient was seen in consultation today for uterine fibroids at the request of Mody,Vaishali  Referring Physician(s): Mody,Vaishali  History of Present Illness: Belinda Williams is a 49 y.o. female with a history of severe menorrhagia due to uterine fibroids.  She has been bleeding for 3 months straight and requires use of both tampons and pads continuously changed every 1-2 hours.  Additionally, she has significant dysmenorrhea with very painful cycles.  Her Uterine Fibroid Symptom Severity score was 87.5 / 100 indicating severe symptoms.  Her Health Related Quality of Life score was 6 / 100 indicating a severe decrease in her quality of life.   She has undergone hysterosonography as well as pap smear.  She has discussed hysterectomy with her Gyn but is not an optimal candidate.    Past Medical History:  Diagnosis Date   ALLERGIC RHINITIS    Anemia    on meds   Asthma    as a child - no problem as adult   Headache(784.0)    Heart murmur    as a child - no problem as adult - closed per pt   Lactose intolerance    Lower back pain    MIGRAINE, MENSTRUAL    Morbid obesity (HCC)    PAP SMEAR, ABNORMAL 08/23/2006   Thyroglossal duct cyst    Excision June 2013 by ENT (bates)   Vitamin B 12 deficiency    Vitamin D  deficiency    Weight gain     Past Surgical History:  Procedure Laterality Date   COLONOSCOPY  03/25/2022   DERMOID CYST  EXCISION  08/2011   Neck   DILATATION & CURRETTAGE/HYSTEROSCOPY WITH RESECTOCOPE  02/2012   IR RADIOLOGIST EVAL & MGMT  04/10/2024   thyroductal cyst     vaginal cyst removed      Allergies: Sulfonamide derivatives, Ciprofloxacin, and Penicillins  Medications: Prior to Admission medications  Medication Sig Start Date End Date Taking? Authorizing Provider  Aspirin-Acetaminophen  500-325 MG PACK Take 1 tablet by mouth as needed (headache).   Yes [provider]  aspirin-acetaminophen -caffeine (EXCEDRIN MIGRAINE)  250-250-65 MG tablet Take 1 tablet by mouth every 6 (six) hours as needed for headache (Cramping/bleeding).   Yes [provider]  Iron -Vitamin C (IRON  100/C) 100-250 MG TABS Take 1 tablet by mouth daily at 6 (six) AM. 09/10/19  Yes [provider]  megestrol (MEGACE) 40 MG/ML suspension Take 40 mg by mouth 2 (two) times daily.   Yes [provider]  [name] 1-20 MG-MCG(24) tablet Take 1 tablet by mouth daily. Patient not taking: Reported on 04/10/2024 12/21/23   [provider]  tranexamic acid (LYSTEDA) 650 MG TABS tablet Take 1,300 mg by mouth 3 (three) times daily. Patient not taking: Reported on 04/10/2024 06/09/23   [provider]  ZEPBOUND  7.5 MG/0.5ML injection vial INJECT 0.5 ML (7.5 MG) UNDER THE SKIN ONCE WEEKLY (0.5ML= 50 UNITS) Patient not taking: Reported on 04/10/2024 01/17/24   Geofm Glade PARAS, MD     Family History  Problem Relation Age of Onset   Colon polyps Mother 67   Hypertension Mother    Sleep apnea Father    Obesity Father    Thyroid  disease Sister    Breast cancer Maternal Aunt 45   Rectal cancer Maternal Uncle 66   Diabetes Maternal Grandmother    Hypertension Maternal Grandmother    Stroke Maternal Grandmother 60   Hypertension Other    Colon  cancer Neg Hx    Esophageal cancer Neg Hx    Stomach cancer Neg Hx     Social History   Socioeconomic History   Marital status: Single    Spouse name: Not on file   Number of children: Not on file   Years of education: Not on file   Highest education level: Professional school degree (e.g., MD, DDS, DVM, JD)  Occupational History   Occupationpublishing rights manager of social activities   Tobacco Use   Smoking status: Never   Smokeless tobacco: Never  Vaping Use   Vaping status: Never Used  Substance and Sexual Activity   Alcohol use: Yes    Alcohol/week: 0.0 - 2.0 standard drinks of alcohol    Comment: socially   Drug use: No   Sexual activity: Not Currently    Birth  control/protection: None    Comment: Single, not sexual active at this time  Other Topics Concern   Not on file  Social History Narrative   Not on file   Social Drivers of Health   Tobacco Use: Low Risk (12/25/2023)   Patient History    Smoking Tobacco Use: Never    Smokeless Tobacco Use: Never    Passive Exposure: Not on file  Financial Resource Strain: Low Risk (06/15/2023)   Overall Financial Resource Strain (CARDIA)    Difficulty of Paying Living Expenses: Not very hard  Food Insecurity: No Food Insecurity (06/15/2023)   Hunger Vital Sign    Worried About Running Out of Food in the Last Year: Never true    Ran Out of Food in the Last Year: Never true  Transportation Needs: No Transportation Needs (06/15/2023)   PRAPARE - Administrator, Civil Service (Medical): No    Lack of Transportation (Non-Medical): No  Physical Activity: Unknown (06/15/2023)   Exercise Vital Sign    Days of Exercise per Week: 0 days    Minutes of Exercise per Session: Not on file  Stress: Stress Concern Present (06/15/2023)   [employer] of Occupational Health - Occupational Stress Questionnaire    Feeling of Stress : To some extent  Social Connections: Moderately Integrated (06/15/2023)   Social Connection and Isolation Panel    Frequency of Communication with Friends and Family: More than three times a week    Frequency of Social Gatherings with Friends and Family: Once a week    Attends Religious Services: 1 to 4 times per year    Active Member of Clubs or Organizations: Yes    Attends Banker Meetings: More than 4 times per year    Marital Status: Never married  Depression (PHQ2-9): Low Risk (12/27/2023)   Depression (PHQ2-9)    PHQ-2 Score: 0  Alcohol Screen: Low Risk (06/15/2023)   Alcohol Screen    Last Alcohol Screening Score (AUDIT): 3  Housing: Low Risk (06/15/2023)   Housing Stability Vital Sign    Unable to Pay for Housing in the Last Year: No    Number of Times  Moved in the Last Year: 0    Homeless in the Last Year: No  Utilities: Not on file  Health Literacy: Not on file    Review of Systems: A 12 point ROS discussed and pertinent positives are indicated in the HPI above.  All other systems are negative.  Review of Systems  Vital Signs: BP (!) 147/85 (BP Location: Left Arm, Patient Position: Sitting, Cuff Size: Normal)   Pulse 87   Temp 98.2 F (36.8 C) (  Oral)   Resp 16   Wt 101.2 kg   SpO2 99%   BMI 39.50 kg/m     Physical Exam Constitutional:      General: She is not in acute distress.    Appearance: Normal appearance. She is obese.  HENT:     Head: Normocephalic and atraumatic.  Eyes:     General: No scleral icterus. Cardiovascular:     Rate and Rhythm: Normal rate.  Pulmonary:     Effort: Pulmonary effort is normal.  Abdominal:     General: There is no distension.     Tenderness: There is no abdominal tenderness. There is no guarding.  Skin:    General: Skin is warm and dry.  Neurological:     Mental Status: She is alert and oriented to person, place, and time.  Psychiatric:        Mood and Affect: Mood normal.        Behavior: Behavior normal.       Imaging: IR Radiologist Eval & Mgmt Result Date: 04/10/2024 EXAM: NEW PATIENT OFFICE VISIT CHIEF COMPLAINT: SEE NOTE IN EPIC HISTORY OF PRESENT ILLNESS: SEE NOTE IN EPIC REVIEW OF SYSTEMS: SEE NOTE IN EPIC PHYSICAL EXAMINATION: SEE NOTE IN EPIC ASSESSMENT AND PLAN: SEE NOTE IN EPIC Electronically Signed   By: Wilkie Lent M.D.   On: 04/10/2024 14:55   MR PELVIS W WO CONTRAST Result Date: 04/01/2024 CLINICAL DATA:  Uterine fibroids EXAM: MRI PELVIS WITHOUT AND WITH CONTRAST TECHNIQUE: Multiplanar multisequence MR imaging of the pelvis was performed both before and after administration of intravenous contrast. CONTRAST:  10mL GADAVIST  GADOBUTROL  1 MMOL/ML IV SOLN COMPARISON:  None Available. FINDINGS: Urinary Tract:  No abnormality visualized. Bowel:  Unremarkable  visualized pelvic bowel loops. Vascular/Lymphatic: No pathologically enlarged lymph nodes. No significant vascular abnormality seen. Reproductive: Bulky fibroid uterus measuring up to 14.1 x 9.1 x 10.1 cm (series 9, image 21, series 3, image 19). Endometrial cavity is almost completely effaced by a large, dominant submucosal fibroid at the right aspect of the uterine fundus measuring at least 8.0 x 7.9 x 7.6 cm (series 7, image 32, series 9, image 16). Multiple additional much smaller fibroids. Small benign nabothian cysts of the cervix. Normal ovaries. Other:  None. Musculoskeletal: No suspicious bone lesions identified. IMPRESSION: 1. Bulky fibroid uterus measuring up to 14.1 x 9.1 x 10.1 cm. 2. Endometrial cavity is almost completely effaced by a large, dominant submucosal fibroid at the right aspect of the uterine fundus measuring at least 8.0 x 7.9 x 7.6 cm. Multiple additional much smaller fibroids. Electronically Signed   By: Marolyn JONETTA Jaksch M.D.   On: 04/01/2024 16:33    Labs:  CBC: Recent Labs    06/16/23 0921  WBC 4.2  HGB 11.4*  HCT 36.0  PLT 321.0    COAGS: No results for input(s): INR, APTT in the last 8760 hours.  BMP: Recent Labs    06/16/23 0921  NA 138  K 4.5  CL 105  CO2 28  GLUCOSE 82  BUN 11  CALCIUM 8.8  CREATININE 0.82    LIVER FUNCTION TESTS: Recent Labs    06/16/23 0921  BILITOT 0.4  AST 9  ALT 6  ALKPHOS 77  PROT 7.0  ALBUMIN 3.6    TUMOR MARKERS: No results for input(s): AFPTM, CEA, CA199, CHROMGRNA in the last 8760 hours.  Assessment and Plan:  Very pleasant 49 yo female with highly symptomatic uterine fibroids.  Her uterine fibroid symptom severity  score is 87.5 consistent with severe symptoms and her UFQOL score is an abysmal 6 out 100 indicated a severe disruption of her quality of life.    I have reviewed her history, exam and MRI and she is an excellent candidate for UAE.  We discussed the risks, benefits and alternatives to  the procedure and she is very interested in proceeding with treatment.   1.) Please schedule for UAE with superior hypogastric nerve block to be performed as an out-pt if possible.   Thank you for this interesting consult.  I greatly enjoyed meeting Belinda Williams and look forward to participating in their care.  A copy of this report was sent to the requesting provider on this date.  Electronically Signed: Wilkie MARLA Lent 04/10/2024, 3:01 PM   I spent a total of  40 Minutes  in face to face in clinical consultation, greater than 50% of which was counseling/coordinating care for uterine fibroids.   "

## 2024-04-12 ENCOUNTER — Other Ambulatory Visit: Payer: Self-pay | Admitting: Interventional Radiology

## 2024-04-12 DIAGNOSIS — D259 Leiomyoma of uterus, unspecified: Secondary | ICD-10-CM

## 2024-04-18 ENCOUNTER — Other Ambulatory Visit: Payer: Self-pay | Admitting: Obstetrics & Gynecology

## 2024-04-18 DIAGNOSIS — D649 Anemia, unspecified: Secondary | ICD-10-CM

## 2024-04-20 ENCOUNTER — Other Ambulatory Visit (HOSPITAL_COMMUNITY): Payer: Self-pay | Admitting: Obstetrics & Gynecology

## 2024-04-20 ENCOUNTER — Telehealth (HOSPITAL_COMMUNITY): Payer: Self-pay

## 2024-04-20 NOTE — Telephone Encounter (Signed)
 Auth Submission: NO AUTH NEEDED Site of care: Site of care: [facility] Payer: [employer] Medication & CPT/J Code(s) submitted: Venofer  (Iron  Sucrose) J1756 Diagnosis Code: D50.9 Route of submission (phone, fax, portal):  Phone # Fax # Auth type: Buy/Bill HB Units/visits requested: 300mg  x 3 doses Reference number:  Approval from: 04/20/24 to 07/18/24

## 2024-04-20 NOTE — Progress Notes (Signed)
 Received referral for [facility].  Venofer  300mg  IV weekly x 3 doses  Dewey Neukam, PharmD, MPH, BCPS, CPP Clinical Pharmacist

## 2024-05-15 ENCOUNTER — Other Ambulatory Visit
